# Patient Record
Sex: Male | Born: 1955
Health system: Southern US, Community
[De-identification: ages and names within clinical notes are randomized; demographics above are authoritative.]

## PROBLEM LIST (undated history)

## (undated) DIAGNOSIS — F4310 Post-traumatic stress disorder, unspecified: Secondary | ICD-10-CM

## (undated) DIAGNOSIS — R3129 Other microscopic hematuria: Secondary | ICD-10-CM

## (undated) DIAGNOSIS — I10 Essential (primary) hypertension: Secondary | ICD-10-CM

## (undated) DIAGNOSIS — F1010 Alcohol abuse, uncomplicated: Secondary | ICD-10-CM

## (undated) DIAGNOSIS — Z765 Malingerer [conscious simulation]: Secondary | ICD-10-CM

## (undated) DIAGNOSIS — E559 Vitamin D deficiency, unspecified: Secondary | ICD-10-CM

## (undated) HISTORY — DX: Vitamin D deficiency, unspecified: E55.9

## (undated) HISTORY — PX: ROTATOR CUFF REPAIR: SHX139

## (undated) HISTORY — DX: Other microscopic hematuria: R31.29

## (undated) HISTORY — PX: TONSILLECTOMY: SUR1361

## (undated) HISTORY — DX: Essential (primary) hypertension: I10

## (undated) HISTORY — PX: APPENDECTOMY: SHX54

---

## 2002-05-26 ENCOUNTER — Emergency Department (HOSPITAL_COMMUNITY): Admission: EM | Admit: 2002-05-26 | Discharge: 2002-05-26 | Payer: Self-pay | Admitting: Emergency Medicine

## 2004-06-02 ENCOUNTER — Ambulatory Visit: Payer: Self-pay

## 2008-11-04 ENCOUNTER — Emergency Department (HOSPITAL_COMMUNITY): Admission: EM | Admit: 2008-11-04 | Discharge: 2008-11-04 | Payer: Self-pay | Admitting: Emergency Medicine

## 2008-11-26 ENCOUNTER — Emergency Department (HOSPITAL_COMMUNITY): Admission: EM | Admit: 2008-11-26 | Discharge: 2008-11-26 | Payer: Self-pay | Admitting: Emergency Medicine

## 2011-02-17 ENCOUNTER — Emergency Department (HOSPITAL_COMMUNITY)
Admission: EM | Admit: 2011-02-17 | Discharge: 2011-02-18 | Disposition: A | Discharge status: Home / Self Care | Payer: Self-pay | Attending: Emergency Medicine | Admitting: Emergency Medicine

## 2011-02-17 ENCOUNTER — Encounter: Payer: Self-pay | Admitting: Emergency Medicine

## 2011-02-17 DIAGNOSIS — S61209A Unspecified open wound of unspecified finger without damage to nail, initial encounter: Secondary | ICD-10-CM | POA: Insufficient documentation

## 2011-02-17 DIAGNOSIS — S61218A Laceration without foreign body of other finger without damage to nail, initial encounter: Secondary | ICD-10-CM

## 2011-02-17 DIAGNOSIS — W260XXA Contact with knife, initial encounter: Secondary | ICD-10-CM | POA: Insufficient documentation
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
## 2011-02-18 MED ORDER — AMOXICILLIN-POT CLAVULANATE 875-125 MG PO TABS
1.0000 | ORAL_TABLET | ORAL | Status: AC
  Administered 2011-02-18: 1 via ORAL
  Filled 2011-02-18: qty 1

## 2011-02-18 MED ORDER — AMOXICILLIN-POT CLAVULANATE 875-125 MG PO TABS: 1.0000 | ORAL_TABLET | Freq: Two times a day (BID) | ORAL | Status: AC

## 2011-02-18 MED ORDER — TETANUS-DIPHTHERIA TOXOIDS TD 5-2 LFU IM INJ
0.5000 mL | INJECTION | Freq: Once | INTRAMUSCULAR | Status: AC
  Administered 2011-02-18: 0.5 mL via INTRAMUSCULAR
  Filled 2011-02-18: qty 0.5

## 2011-02-18 NOTE — ED Provider Notes (Signed)
Medical screening examination/treatment/procedure(s) were performed by non-physician practitioner and as supervising physician I was immediately available for consultation/collaboration.   Viera Okonski L Deondre Marinaro, MD 02/18/11 0732 
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
## 2011-02-18 NOTE — ED Notes (Signed)
Pt awaiting meds from pharmacy- notified of same.

## 2015-05-23 ENCOUNTER — Ambulatory Visit (INDEPENDENT_AMBULATORY_CARE_PROVIDER_SITE_OTHER): Payer: Self-pay | Admitting: Internal Medicine

## 2015-05-23 ENCOUNTER — Encounter: Payer: Self-pay | Admitting: Internal Medicine

## 2015-05-23 VITALS — BP 161/119 | HR 106 | Temp 97.6°F | Ht 69.0 in | Wt 145.8 lb

## 2015-05-23 DIAGNOSIS — M25511 Pain in right shoulder: Secondary | ICD-10-CM

## 2015-05-23 DIAGNOSIS — F17200 Nicotine dependence, unspecified, uncomplicated: Secondary | ICD-10-CM

## 2015-05-23 DIAGNOSIS — Z0000 Encounter for general adult medical examination without abnormal findings: Secondary | ICD-10-CM

## 2015-05-23 DIAGNOSIS — G4700 Insomnia, unspecified: Secondary | ICD-10-CM | POA: Insufficient documentation

## 2015-05-23 DIAGNOSIS — R3129 Other microscopic hematuria: Secondary | ICD-10-CM | POA: Insufficient documentation

## 2015-05-23 DIAGNOSIS — Z1211 Encounter for screening for malignant neoplasm of colon: Secondary | ICD-10-CM

## 2015-05-23 DIAGNOSIS — Z720 Tobacco use: Secondary | ICD-10-CM | POA: Insufficient documentation

## 2015-05-23 DIAGNOSIS — I10 Essential (primary) hypertension: Secondary | ICD-10-CM | POA: Insufficient documentation

## 2015-05-23 NOTE — Assessment & Plan Note (Signed)
Had microscopic hematuria in the past per patient, was put on meds for this per patient. I don't have any records to confirm this. I wanted to repeat UA today but patient could not give us a sample. -will try to get [facility] (patient stated his ortho doctor put him on medication for hematuria).

## 2015-05-23 NOTE — Assessment & Plan Note (Signed)
Has some insomnia (trouble falling asleep). Watched TV until late night, drinks [facility]L of [facility]). Shares house with room mates so they do all of their activities (eating, watching tv, etc. In their room). Also has 2 dogs that sleep with them.  - likely 2/2 to poor sleep hygiene and caffeine. Consulted to cut down on caffeine intake and to stop watching TV at least 1-2 hours before going to bed.
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
## 2015-05-23 NOTE — Assessment & Plan Note (Signed)
Had right shoulder rotator cuff tear s/p surgery on 10/2013. Has residual pain still on right shoulder. Naproxen helps.  -asked to continue PRN tylenol or NSAID for pain.

## 2015-05-23 NOTE — Assessment & Plan Note (Addendum)
Filed Vitals:   05/23/15 1038  BP: 161/119  Pulse: 106  Temp: 97.6 F (36.4 C)   BP elevated. He was on lisinopril in the past but not on amy meds now. Will check CMET today, if everything is normal may put him back to lisinopril low dose.  F/up in 3 months.

## 2015-05-23 NOTE — Assessment & Plan Note (Signed)
Continues to smoke 1/2 PPD. Wants to quit but not ready for it yet. He wants to try nicotine patches in the future if he is able to obtain the orange card.

## 2015-05-23 NOTE — Patient Instructions (Signed)
We will get some basic lab today to start off.  Based on lab results I will probably start you on a blood pressure medication. Will call you and let you know.  Please return the stool card with stool sample.  Work on getting the orange card.  Take tylenol for joint pain.

## 2015-05-23 NOTE — Assessment & Plan Note (Signed)
Due for cscope but will do stool card for now since he has no insurance.

## 2015-05-24 LAB — COMPREHENSIVE METABOLIC PANEL
A/G RATIO: 1.8 (ref 1.1–2.5)
ALBUMIN: 4.3 g/dL (ref 3.6–4.8)
ALT: 70 IU/L — ABNORMAL HIGH (ref 0–44)
AST: 105 IU/L — ABNORMAL HIGH (ref 0–40)
Alkaline Phosphatase: 48 IU/L (ref 39–117)
BILIRUBIN TOTAL: 0.4 mg/dL (ref 0.0–1.2)
BUN / CREAT RATIO: 9 — AB (ref 10–22)
BUN: 7 mg/dL — ABNORMAL LOW (ref 8–27)
CALCIUM: 9.4 mg/dL (ref 8.6–10.2)
CHLORIDE: 96 mmol/L (ref 96–106)
CO2: 24 mmol/L (ref 18–29)
Creatinine, Ser: 0.82 mg/dL (ref 0.76–1.27)
GFR, EST AFRICAN AMERICAN: 110 mL/min/{1.73_m2} (ref >59)
GFR, EST NON AFRICAN AMERICAN: 95 mL/min/{1.73_m2} (ref >59)
Globulin, Total: 2.4 g/dL (ref 1.5–4.5)
Glucose: 91 mg/dL (ref 65–99)
POTASSIUM: 4.6 mmol/L (ref 3.5–5.2)
Sodium: 138 mmol/L (ref 134–144)
TOTAL PROTEIN: 6.7 g/dL (ref 6.0–8.5)

## 2015-05-24 LAB — CBC
HEMATOCRIT: 45.5 % (ref 37.5–51.0)
HEMOGLOBIN: 16.4 g/dL (ref 12.6–17.7)
MCH: 34.7 pg — AB (ref 26.6–33.0)
MCHC: 36 g/dL — AB (ref 31.5–35.7)
MCV: 96 fL (ref 79–97)
Platelets: 255 10*3/uL (ref 150–379)
RBC: 4.72 x10E6/uL (ref 4.14–5.80)
RDW: 13.9 % (ref 12.3–15.4)
WBC: 5.9 10*3/uL (ref 3.4–10.8)

## 2015-05-24 NOTE — Progress Notes (Signed)
Medicine attending: Medical history, presenting problems, physical findings, and medications, reviewed with resident physician Dr Tasrif Ahmed on the day of the patient visit and I concur with his evaluation and management plan. 

## 2015-05-27 ENCOUNTER — Ambulatory Visit: Payer: Self-pay

## 2015-05-31 ENCOUNTER — Other Ambulatory Visit (INDEPENDENT_AMBULATORY_CARE_PROVIDER_SITE_OTHER): Payer: Self-pay

## 2015-05-31 DIAGNOSIS — Z1211 Encounter for screening for malignant neoplasm of colon: Secondary | ICD-10-CM

## 2015-05-31 LAB — POC HEMOCCULT BLD/STL (HOME/3-CARD/SCREEN)
Card #2 Fecal Occult Blod, POC: NEGATIVE
Card #3 Fecal Occult Blood, POC: NEGATIVE
FECAL OCCULT BLD: NEGATIVE

## 2015-05-31 NOTE — Addendum Note (Signed)
Addended by: Bufford SpikesFULCHER, Jacklin Zwick N on: 05/31/2015 02:19 PM   Modules accepted: Orders

## 2015-06-05 ENCOUNTER — Telehealth: Payer: Self-pay | Admitting: Internal Medicine

## 2015-06-05 NOTE — Telephone Encounter (Signed)
APPT. REMINDER CALL, LMTCB °

## 2015-06-06 ENCOUNTER — Ambulatory Visit: Payer: Self-pay

## 2015-06-15 ENCOUNTER — Emergency Department (HOSPITAL_COMMUNITY)
Admission: EM | Admit: 2015-06-15 | Discharge: 2015-06-15 | Disposition: A | Discharge status: Home / Self Care | Payer: Self-pay | Attending: Emergency Medicine | Admitting: Emergency Medicine

## 2015-06-15 ENCOUNTER — Encounter (HOSPITAL_COMMUNITY): Payer: Self-pay | Admitting: *Deleted

## 2015-06-15 DIAGNOSIS — Y9301 Activity, walking, marching and hiking: Secondary | ICD-10-CM | POA: Insufficient documentation

## 2015-06-15 DIAGNOSIS — I10 Essential (primary) hypertension: Secondary | ICD-10-CM | POA: Insufficient documentation

## 2015-06-15 DIAGNOSIS — F17210 Nicotine dependence, cigarettes, uncomplicated: Secondary | ICD-10-CM | POA: Insufficient documentation

## 2015-06-15 DIAGNOSIS — Y92410 Unspecified street and highway as the place of occurrence of the external cause: Secondary | ICD-10-CM | POA: Insufficient documentation

## 2015-06-15 DIAGNOSIS — S59912A Unspecified injury of left forearm, initial encounter: Secondary | ICD-10-CM | POA: Insufficient documentation

## 2015-06-15 DIAGNOSIS — Y998 Other external cause status: Secondary | ICD-10-CM | POA: Insufficient documentation
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
## 2015-06-19 ENCOUNTER — Telehealth: Payer: Self-pay | Admitting: Internal Medicine

## 2015-06-19 NOTE — Telephone Encounter (Signed)
APPT. REMINDER CALL, LMTCB °

## 2015-06-20 ENCOUNTER — Ambulatory Visit: Payer: Self-pay

## 2015-06-26 ENCOUNTER — Encounter: Payer: Self-pay | Admitting: Internal Medicine

## 2015-06-26 ENCOUNTER — Ambulatory Visit (INDEPENDENT_AMBULATORY_CARE_PROVIDER_SITE_OTHER): Payer: Self-pay | Admitting: Internal Medicine

## 2015-06-26 VITALS — BP 165/121 | HR 112 | Temp 98.2°F | Ht 69.0 in | Wt 143.6 lb

## 2015-06-26 DIAGNOSIS — R3129 Other microscopic hematuria: Secondary | ICD-10-CM

## 2015-06-26 DIAGNOSIS — M25511 Pain in right shoulder: Secondary | ICD-10-CM

## 2015-06-26 DIAGNOSIS — Z87448 Personal history of other diseases of urinary system: Secondary | ICD-10-CM

## 2015-06-26 DIAGNOSIS — I10 Essential (primary) hypertension: Secondary | ICD-10-CM

## 2015-06-26 DIAGNOSIS — Z720 Tobacco use: Secondary | ICD-10-CM

## 2015-06-26 DIAGNOSIS — F17210 Nicotine dependence, cigarettes, uncomplicated: Secondary | ICD-10-CM

## 2015-06-26 DIAGNOSIS — Z0000 Encounter for general adult medical examination without abnormal findings: Secondary | ICD-10-CM

## 2015-06-26 DIAGNOSIS — R52 Pain, unspecified: Secondary | ICD-10-CM

## 2015-06-26 MED ORDER — HYDROCHLOROTHIAZIDE 25 MG PO TABS: 25.0000 mg | ORAL_TABLET | Freq: Every day | ORAL | Status: DC

## 2015-06-26 NOTE — Assessment & Plan Note (Signed)
Smokes about 1/2 ppd since 60 yo.  States he has cut back recently. -advised smoking cessation and given info on quitline -may be a candidate for low-dose CT scan given history

## 2015-06-26 NOTE — Progress Notes (Signed)
Patient ID: Warren Patrick, male   DOB: 01/06/1956, 60 y.o.   MRN: [id number]     Subjective:   Patient ID: Warren Patrick male    DOB: 03/07/1956 60 y.o.    MRN: [id number] Health Maintenance Due: Health Maintenance Due  Topic Date Due  . Hepatitis C Screening  01-24-1956  . HIV Screening  04/23/1969  . COLONOSCOPY  04/23/2004  . ZOSTAVAX  04/23/2014    _________________________________________________  HPI: Mr.Devrin W Georganna SkeansRoscoe is a 60 y.o. male here for f/u visit for HTN and chronic pain.  We will address his chronic pain at a future visit as we discussed.   Pt has a PMH outlined below.  Please see problem-based charting assessment and plan for further status of patient's chronic medical problems addressed at today's visit.  PMH: Past Medical History  Diagnosis Date  . HTN (hypertension)   . Hematuria, microscopic     Medications: No current outpatient prescriptions on file prior to visit.   No current facility-administered medications on file prior to visit.    Allergies: Allergies  Allergen Reactions  . Bee Venom Swelling    FH: Family History  Problem Relation Age of Onset  . Cancer Mother     breast cancer  . Cancer Father     lung cancer  . Cancer Brother     pancreatic cancer  . Hypertension Father   . Diabetes Brother   . Diabetes Sister     SH: Social History   Social History  . Marital Status: Divorced    Spouse Name: N/A  . Number of Children: N/A  . Years of Education: N/A   Social History Main Topics  . Smoking status: Current Every Day Smoker -- 0.50 packs/day    Types: Cigarettes  . Smokeless tobacco: None     Comment: 1/2PPD  . Alcohol Use: 16.8 oz/week    28 Standard drinks or equivalent per week     Comment: Mixed drinks daily.  . Drug Use: No  . Sexual Activity:    Partners: Female   Other Topics Concern  . None   Social History Narrative    Review of Systems: Constitutional: Negative for fever, chills.    Cardiovascular: +chest pain.  Gastrointestinal: +nausea, vomiting. Neurological: +dizziness.   Objective:   Vital Signs: Filed Vitals:   06/26/15 1353  BP: 165/121  Pulse: 112  Temp: 98.2 F (36.8 C)  TempSrc: Oral  Height: 5\' 9"  (1.753 m)  Weight: 143 lb 9.6 oz (65.137 kg)  SpO2: 100%      BP Readings from Last 3 Encounters:  06/26/15 165/121  06/15/15 172/125  05/23/15 161/119    Physical Exam: Constitutional: Vital signs reviewed.  Patient is in NAD and cooperative with exam.  Head: Normocephalic and atraumatic. Eyes: EOMI, conjunctivae nl, no scleral icterus.  Neck: Supple. Cardiovascular: RRR, no MRG. Pulmonary/Chest: normal effort, CTAB, no wheezes, rales, or rhonchi. Abdominal: Soft. NT/ND +BS. Musculoskeletal: Heberden's and bouchard's nodes b/l.  Neurological: A&O x3, cranial nerves II-XII are grossly intact, moving all extremities. Extremities: No LE edema. Skin: Warm, dry and intact. No rash.   Assessment & Plan:   Assessment and plan was discussed and formulated with my attending.

## 2015-06-26 NOTE — Assessment & Plan Note (Addendum)
Pt presents for f/u of HTN.  Denies fever/chills.  Endorses occasional non-exertional chest pain which he describes as sharp.  States it mainly occurs while watching TV.  Continues to smoke 1/2 ppd since 12.  Drinks 2-3 mixed drinks per night to help with sleep.   -begin HCTZ 25mg   -f/u in 2 weeks  -advised smoking cessation and to limit drinks to 2/night  -lipid panel

## 2015-06-26 NOTE — Assessment & Plan Note (Addendum)
Will discuss colonoscopy at next OV.  -needs colonoscopy

## 2015-06-26 NOTE — Patient Instructions (Signed)
Thank you for your visit today.   Please return to the internal medicine clinic in about 2 weeks or sooner if needed to recheck BP.     I have made the following additions/changes to your medications:  I have given you a prescription for your blood pressure medication, hydrochlorothizide), please take this daily at the same time.   Please be sure to bring all of your medications with you to every visit; this includes herbal supplements, vitamins, eye drops, and any over-the-counter medications.   Should you have any questions regarding your medications and/or any new or worsening symptoms, please be sure to call the clinic at [phone].   If you believe that you are suffering from a life threatening condition or one that may result in the loss of limb or function, then you should call 911 and proceed to the nearest Emergency Department.   Smoking Cessation, Tips for Success If you are ready to quit smoking, congratulations! You have chosen to help yourself be healthier. Cigarettes bring nicotine, tar, carbon monoxide, and other irritants into your body. Your lungs, heart, and blood vessels will be able to work better without these poisons. There are many different ways to quit smoking. Nicotine gum, nicotine patches, a nicotine inhaler, or nicotine nasal spray can help with physical craving. Hypnosis, support groups, and medicines help break the habit of smoking. WHAT THINGS CAN I DO TO MAKE QUITTING EASIER?  Here are some tips to help you quit for good:  Pick a date when you will quit smoking completely. Tell all of your friends and family about your plan to quit on that date.  Do not try to slowly cut down on the number of cigarettes you are smoking. Pick a quit date and quit smoking completely starting on that day.  Throw away all cigarettes.   Clean and remove all ashtrays from your home, work, and car.  On a card, write down your reasons for quitting. Carry the card with you and  read it when you get the urge to smoke.  Cleanse your body of nicotine. Drink enough water and fluids to keep your urine clear or pale yellow. Do this after quitting to flush the nicotine from your body.  Learn to predict your moods. Do not let a bad situation be your excuse to have a cigarette. Some situations in your life might tempt you into wanting a cigarette.  Never have "just one" cigarette. It leads to wanting another and another. Remind yourself of your decision to quit.  Change habits associated with smoking. If you smoked while driving or when feeling stressed, try other activities to replace smoking. Stand up when drinking your coffee. Brush your teeth after eating. Sit in a different chair when you read the paper. Avoid alcohol while trying to quit, and try to drink fewer caffeinated beverages. Alcohol and caffeine may urge you to smoke.  Avoid foods and drinks that can trigger a desire to smoke, such as sugary or spicy foods and alcohol.  Ask people who smoke not to smoke around you.  Have something planned to do right after eating or having a cup of coffee. For example, plan to take a walk or exercise.  Try a relaxation exercise to calm you down and decrease your stress. Remember, you may be tense and nervous for the first 2 weeks after you quit, but this will pass.  Find new activities to keep your hands busy. Play with a pen, coin, or rubber band. Doodle  or draw things on paper.  Brush your teeth right after eating. This will help cut down on the craving for the taste of tobacco after meals. You can also try mouthwash.   Use oral substitutes in place of cigarettes. Try using lemon drops, carrots, cinnamon sticks, or chewing gum. Keep them handy so they are available when you have the urge to smoke.  When you have the urge to smoke, try deep breathing.  Designate your home as a nonsmoking area.  If you are a heavy smoker, ask your health care provider about a prescription  for nicotine chewing gum. It can ease your withdrawal from nicotine.  Reward yourself. Set aside the cigarette money you save and buy yourself something nice.  Look for support from others. Join a support group or smoking cessation program. Ask someone at home or at work to help you with your plan to quit smoking.  Always ask yourself, "Do I need this cigarette or is this just a reflex?" Tell yourself, "Today, I choose not to smoke," or "I do not want to smoke." You are reminding yourself of your decision to quit.  Do not replace cigarette smoking with electronic cigarettes (commonly called e-cigarettes). The safety of e-cigarettes is unknown, and some may contain harmful chemicals.  If you relapse, do not give up! Plan ahead and think about what you will do the next time you get the urge to smoke. HOW WILL I FEEL WHEN I QUIT SMOKING? You may have symptoms of withdrawal because your body is used to nicotine (the addictive substance in cigarettes). You may crave cigarettes, be irritable, feel very hungry, cough often, get headaches, or have difficulty concentrating. The withdrawal symptoms are only temporary. They are strongest when you first quit but will go away within 10-14 days. When withdrawal symptoms occur, stay in control. Think about your reasons for quitting. Remind yourself that these are signs that your body is healing and getting used to being without cigarettes. Remember that withdrawal symptoms are easier to treat than the major diseases that smoking can cause.  Even after the withdrawal is over, expect periodic urges to smoke. However, these cravings are generally short lived and will go away whether you smoke or not. Do not smoke! WHAT RESOURCES ARE AVAILABLE TO HELP ME QUIT SMOKING? Your health care provider can direct you to community resources or hospitals for support, which may include:  Group support.  Education.  Hypnosis.  Therapy.   This information is not intended to  replace advice given to you by your health care provider. Make sure you discuss any questions you have with your health care provider.   Document Released: 11/29/2003 Document Revised: 03/23/2014 Document Reviewed: 08/18/2012 Elsevier Interactive Patient Education 2016 Elsevier Inc.  DASH Eating Plan DASH stands for "Dietary Approaches to Stop Hypertension." The DASH eating plan is a healthy eating plan that has been shown to reduce high blood pressure (hypertension). Additional health benefits may include reducing the risk of type 2 diabetes mellitus, heart disease, and stroke. The DASH eating plan may also help with weight loss. WHAT DO I NEED TO KNOW ABOUT THE DASH EATING PLAN? For the DASH eating plan, you will follow these general guidelines:  Choose foods with a percent daily value for sodium of less than 5% (as listed on the food label).  Use salt-free seasonings or herbs instead of table salt or sea salt.  Check with your health care provider or pharmacist before using salt substitutes.  Eat lower-sodium  products, often labeled as "lower sodium" or "no salt added."  Eat fresh foods.  Eat more vegetables, fruits, and low-fat dairy products.  Choose whole grains. Look for the word "whole" as the first word in the ingredient list.  Choose fish and skinless chicken or [place] more often than red meat. Limit fish, poultry, and meat to 6 oz (170 g) each day.  Limit sweets, desserts, sugars, and sugary drinks.  Choose heart-healthy fats.  Limit cheese to 1 oz (28 g) per day.  Eat more home-cooked food and less restaurant, buffet, and fast food.  Limit fried foods.  Cook foods using methods other than frying.  Limit canned vegetables. If you do use them, rinse them well to decrease the sodium.  When eating at a restaurant, ask that your food be prepared with less salt, or no salt if possible. WHAT FOODS CAN I EAT? Seek help from a dietitian for individual calorie  needs. Grains Whole grain or whole wheat bread. Brown rice. Whole grain or whole wheat pasta. Quinoa, bulgur, and whole grain cereals. Low-sodium cereals. Corn or whole wheat flour tortillas. Whole grain cornbread. Whole grain crackers. Low-sodium crackers. Vegetables Fresh or frozen vegetables (raw, steamed, roasted, or grilled). Low-sodium or reduced-sodium tomato and vegetable juices. Low-sodium or reduced-sodium tomato sauce and paste. Low-sodium or reduced-sodium canned vegetables.  Fruits All fresh, canned (in natural juice), or frozen fruits. Meat and Other Protein Products Ground beef (85% or leaner), grass-fed beef, or beef trimmed of fat. Skinless chicken or [place]. Ground chicken or [place]. Pork trimmed of fat. All fish and seafood. Eggs. Dried beans, peas, or lentils. Unsalted nuts and seeds. Unsalted canned beans. Dairy Low-fat dairy products, such as skim or 1% milk, 2% or reduced-fat cheeses, low-fat ricotta or cottage cheese, or plain low-fat yogurt. Low-sodium or reduced-sodium cheeses. Fats and Oils Tub margarines without trans fats. Light or reduced-fat mayonnaise and salad dressings (reduced sodium). Avocado. Safflower, olive, or canola oils. Natural peanut or almond butter. Other Unsalted popcorn and pretzels. The items listed above may not be a complete list of recommended foods or beverages. Contact your dietitian for more options. WHAT FOODS ARE NOT RECOMMENDED? Grains White bread. White pasta. White rice. Refined cornbread. Bagels and croissants. Crackers that contain trans fat. Vegetables Creamed or fried vegetables. Vegetables in a cheese sauce. Regular canned vegetables. Regular canned tomato sauce and paste. Regular tomato and vegetable juices. Fruits Dried fruits. Canned fruit in light or heavy syrup. Fruit juice. Meat and Other Protein Products Fatty cuts of meat. Ribs, chicken wings, bacon, sausage, bologna, salami, chitterlings, fatback, hot dogs, bratwurst,  and packaged luncheon meats. Salted nuts and seeds. Canned beans with salt. Dairy Whole or 2% milk, cream, half-and-half, and cream cheese. Whole-fat or sweetened yogurt. Full-fat cheeses or blue cheese. Nondairy creamers and whipped toppings. Processed cheese, cheese spreads, or cheese curds. Condiments Onion and garlic salt, seasoned salt, table salt, and sea salt. Canned and packaged gravies. Worcestershire sauce. Tartar sauce. Barbecue sauce. Teriyaki sauce. Soy sauce, including reduced sodium. Steak sauce. Fish sauce. Oyster sauce. Cocktail sauce. Horseradish. Ketchup and mustard. Meat flavorings and tenderizers. Bouillon cubes. Hot sauce. Tabasco sauce. Marinades. Taco seasonings. Relishes. Fats and Oils Butter, stick margarine, lard, shortening, ghee, and bacon fat. Coconut, palm kernel, or palm oils. Regular salad dressings. Other Pickles and olives. Salted popcorn and pretzels. The items listed above may not be a complete list of foods and beverages to avoid. Contact your dietitian for more information. WHERE CAN I FIND MORE  INFORMATION? National Heart, Lung, and Blood Institute: CableProm[employer].itwww.nhlbi.nih.gov/health/health-topics/topics/dash/   This information is not intended to replace advice given to you by your health care provider. Make sure you discuss any questions you have with your health care provider.   Document Released: 02/19/2011 Document Revised: 03/23/2014 Document Reviewed: 01/04/2013 Elsevier Interactive Patient Education [employer] Elsevier Inc.

## 2015-06-26 NOTE — Assessment & Plan Note (Addendum)
Was told previously he had hematuria.  No history of exposure to dyes.  However, he is a long term smoker which puts him at risk for malignancy.  When asked if he could provide a urine sample he stated "I just peed before coming in."  Of note, he was unable to provide a urine sample on his last OV.  -check UA

## 2015-06-26 NOTE — Assessment & Plan Note (Signed)
He discussed widespread pain in his hands, feet, back, etc.  I explained that we would discuss this at a future visit. -cont to monitor -will address further

## 2015-06-27 LAB — MICROSCOPIC EXAMINATION: Casts: NONE SEEN /lpf

## 2015-06-27 LAB — LIPID PANEL
CHOLESTEROL TOTAL: 192 mg/dL (ref 100–199)
Chol/HDL Ratio: 3.8 ratio units (ref 0.0–5.0)
HDL: 51 mg/dL (ref >39)
LDL Calculated: 115 mg/dL — ABNORMAL HIGH (ref 0–99)
Triglycerides: 131 mg/dL (ref 0–149)
VLDL CHOLESTEROL CAL: 26 mg/dL (ref 5–40)

## 2015-06-27 LAB — URINALYSIS, ROUTINE W REFLEX MICROSCOPIC
BILIRUBIN UA: NEGATIVE
Glucose, UA: NEGATIVE
Nitrite, UA: POSITIVE — AB
PH UA: 6 (ref 5.0–7.5)
Specific Gravity, UA: 1.019 (ref 1.005–1.030)
UUROB: 0.2 mg/dL (ref 0.2–1.0)

## 2015-07-09 ENCOUNTER — Telehealth: Payer: Self-pay | Admitting: Internal Medicine

## 2015-07-09 NOTE — Telephone Encounter (Signed)
APPT. REMINDER CALL, LMTCB °

## 2015-07-10 ENCOUNTER — Ambulatory Visit: Payer: Self-pay | Admitting: Internal Medicine

## 2015-07-10 NOTE — Progress Notes (Signed)
Internal Medicine Clinic Attending  Case discussed with Dr. Gill soon after the resident saw the patient.  We reviewed the resident's history and exam and pertinent patient test results.  I agree with the assessment, diagnosis, and plan of care documented in the resident's note.  

## 2015-07-16 ENCOUNTER — Ambulatory Visit: Payer: Self-pay | Admitting: Internal Medicine

## 2015-07-19 ENCOUNTER — Telehealth: Payer: Self-pay | Admitting: Internal Medicine

## 2015-07-19 NOTE — Telephone Encounter (Signed)
APT. REMINDER CALL, LMTCB °

## 2015-07-22 ENCOUNTER — Ambulatory Visit (INDEPENDENT_AMBULATORY_CARE_PROVIDER_SITE_OTHER): Payer: Self-pay | Admitting: Internal Medicine

## 2015-07-22 ENCOUNTER — Encounter: Payer: Self-pay | Admitting: Internal Medicine

## 2015-07-22 VITALS — BP 170/110 | HR 80 | Temp 98.1°F | Ht 69.0 in | Wt 139.4 lb

## 2015-07-22 DIAGNOSIS — F32A Depression, unspecified: Secondary | ICD-10-CM

## 2015-07-22 DIAGNOSIS — F329 Major depressive disorder, single episode, unspecified: Secondary | ICD-10-CM

## 2015-07-22 DIAGNOSIS — IMO0002 Reserved for concepts with insufficient information to code with codable children: Secondary | ICD-10-CM

## 2015-07-22 DIAGNOSIS — F1020 Alcohol dependence, uncomplicated: Secondary | ICD-10-CM | POA: Insufficient documentation

## 2015-07-22 DIAGNOSIS — F1094 Alcohol use, unspecified with alcohol-induced mood disorder: Secondary | ICD-10-CM | POA: Insufficient documentation

## 2015-07-22 DIAGNOSIS — Z720 Tobacco use: Secondary | ICD-10-CM

## 2015-07-22 DIAGNOSIS — F1090 Alcohol use, unspecified, uncomplicated: Secondary | ICD-10-CM

## 2015-07-22 DIAGNOSIS — E785 Hyperlipidemia, unspecified: Secondary | ICD-10-CM

## 2015-07-22 DIAGNOSIS — F17210 Nicotine dependence, cigarettes, uncomplicated: Secondary | ICD-10-CM

## 2015-07-22 DIAGNOSIS — I10 Essential (primary) hypertension: Secondary | ICD-10-CM

## 2015-07-22 DIAGNOSIS — F322 Major depressive disorder, single episode, severe without psychotic features: Secondary | ICD-10-CM | POA: Insufficient documentation

## 2015-07-22 MED ORDER — HYDROCHLOROTHIAZIDE 25 MG PO TABS: 25.0000 mg | ORAL_TABLET | Freq: Every day | ORAL | Status: DC

## 2015-07-22 MED ORDER — SIMVASTATIN 80 MG PO TABS: 80.0000 mg | ORAL_TABLET | Freq: Every day | ORAL | Status: DC

## 2015-07-22 NOTE — Progress Notes (Signed)
   Subjective:    Patient ID: Warren Patrick, male    DOB: 10/04/1955, 60 y.o.   MRN: [id number]  HPI  Mr. 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 With respect to his HTN, it was intended for him to be taking HCTZ 25 mg after his appointment on 4/12. However, he never picked up this medication from [facility] as he does not have an independent source of income. Today, he denies any headaches or vision changes. He's had multiple family members who've had heart attacks. He drinks 3 mixed drinks daily, feeling he needs to cut down on alcohol use and occasionally feels guilty. He endorses a depressed mood, including some insomnia, anhedonia, psychomotor agitation, and low energy. He has felt this way since being laid off two years ago from a right rotator cuff injury.He denies any suicidal ideation. He continues to smoke 1/2 ppd and is not ready to quit.   Review of Systems  Constitutional: Positive for fatigue. Negative for fever.  HENT: Negative for congestion and sore throat.   Eyes: Negative for redness and itching.  Respiratory: Negative for shortness of breath and wheezing.   Cardiovascular: Negative for chest pain and palpitations.  Gastrointestinal: Negative for abdominal pain and diarrhea.  Endocrine: Negative for polydipsia and polyuria.  Genitourinary: Negative for dysuria and hematuria.  Musculoskeletal: Positive for back pain. Negative for arthralgias.  Skin: Negative for rash and wound.  Neurological: Negative for light-headedness and headaches.  Psychiatric/Behavioral: Positive for sleep disturbance and dysphoric mood. Negative for suicidal ideas and decreased concentration. The patient is nervous/anxious.        Objective:   Physical Exam  Constitutional:  Thin-appearing man.  HENT:  Mouth/Throat: Oropharynx is clear and moist.  No oropharyngeal exudate.  Eyes: Conjunctivae are normal. No scleral icterus.  Neck: Neck supple.  Cardiovascular: Normal rate, regular rhythm and normal heart sounds.   Pulmonary/Chest: Effort normal and breath sounds normal. No respiratory distress. He has no wheezes.  Abdominal: Soft. Bowel sounds are normal. He exhibits no distension. There is no tenderness.  Musculoskeletal: Normal range of motion. He exhibits no edema.  Lymphadenopathy:    He has no cervical adenopathy.  Neurological: He is alert. He has normal reflexes.  Skin: Skin is warm and dry. He is not diaphoretic.  Psychiatric: He has a normal mood and affect. His behavior is normal.  Vitals reviewed.         Assessment & Plan:   Please see problem based assessment and plan for details.

## 2015-07-22 NOTE — Assessment & Plan Note (Signed)
A: I counseled the patient and showed him that he will spend $18,000 in the future on cigarettes. Counseling was for 5 minutes and included the risks of smoking and smoking cessation strategies. He is not ready to quit at this time.  P: Reassess at next visit

## 2015-07-22 NOTE — Assessment & Plan Note (Signed)
A: Likely driven by his lack of work, which he loved to do, and alcoholism. His depression is minor at this time. SSRI therapy is not warranted at this time.  P: Reassess at future clinic visits.

## 2015-07-22 NOTE — Assessment & Plan Note (Signed)
A: Patient has not picked up his medication due to financial reasons. His BP remains elevated today at 170/110 off of medication. I explained to him that his monthly costs of cigarettes are manyfold greater than the $4 monthly cost of HCTZ. I demonstrated that his 10-year cardiovascular risk score decreases from 22% to ~14% with better control of his BP, to 120/80. He concurred at the end of this discussion that obtaining obtaining his medication would be a priority.  P: HCTZ 25 mg daily Referral to Medication Assistance Program F/u 2 weeks

## 2015-07-22 NOTE — Patient Instructions (Signed)
Warren Patrick,  It was a pleasure meeting you today.  Your blood pressure is very high. This puts you at risk for stroke and heart attack. Your chance of a heart attack over the next 10 years is 22%. By controlling your blood pressure, we can get it down to 14%. If we control you BP and you quit smoking, the risk goes to 9%.   Please pick up your $4 prescription for hydrochlorothiazide from [facility]. Remember, a pack of cigarettes costs ~$5. You will spend $18,000 over the next 20 years on cigarettes if you continue to smoke.  I have also provided [facility]. If you had to choose, go to [facility].  Again, great taking care of you, and have a great day.

## 2015-07-22 NOTE — Assessment & Plan Note (Signed)
A: LDL 115 last visit. 22% 10-year cardiovascular risk score. While he would benefit from a high-intensity statin, simvastatin 80 mg is far more affordable [facility] at $6. However, I told him that if he had to choose between simvastatin and HCTZ, he should opt for HCTZ as this would decrease his cardiovascular risk the most.  P: Simvastatin 80 mg daily

## 2015-07-22 NOTE — Assessment & Plan Note (Signed)
A: Patient drinks 3 mixed alcoholic beverages a day. He denies any withdrawal symptoms. He understand not to stop his alcohol use abruptly. He was counseled for 5 minutes on the risks of long-term alcohol use. He is interested in discussing options and future clinic visits.  P: Reassess in 2 weeks.

## 2015-07-24 MED ORDER — ATORVASTATIN CALCIUM 40 MG PO TABS: 40.0000 mg | ORAL_TABLET | Freq: Every day | ORAL | Status: DC

## 2015-07-24 MED ORDER — HYDROCHLOROTHIAZIDE 25 MG PO TABS: 25.0000 mg | ORAL_TABLET | Freq: Every day | ORAL | Status: DC

## 2015-07-24 MED ORDER — OLMESARTAN MEDOXOMIL 20 MG PO TABS: 20.0000 mg | ORAL_TABLET | Freq: Every day | ORAL | Status: DC

## 2015-07-24 NOTE — Progress Notes (Signed)
Pharmacist-physician Co-visit Assisting with initiation of HCTZ and statin and medication access. Patient states he is unable to afford at this time. [facility] for patient.

## 2015-07-24 NOTE — Progress Notes (Signed)
Case discussed with Dr. Ford at the time of the visit.  We reviewed the resident's history and exam and pertinent patient test results.  I agree with the assessment, diagnosis and plan of care documented in the resident's note. 

## 2015-07-24 NOTE — Addendum Note (Signed)
Addended by: Mliss FritzKIM, JENNIFER J on: 07/24/2015 11:55 AM   Modules accepted: Orders

## 2015-07-24 NOTE — Addendum Note (Signed)
Addended by: Mliss FritzKIM, JENNIFER J on: 07/24/2015 01:09 PM   Modules accepted: Orders, Medications

## 2015-07-24 NOTE — Addendum Note (Signed)
Addended by: Mliss FritzKIM, Tymir Terral J on: 07/24/2015 09:58 AM   Modules accepted: Orders

## 2015-07-24 NOTE — Addendum Note (Signed)
Addended by: Mliss FritzKIM, JENNIFER J on: 07/24/2015 10:56 AM   Modules accepted: Orders, Medications

## 2015-07-26 ENCOUNTER — Ambulatory Visit: Payer: Self-pay | Admitting: Pharmacist

## 2015-07-26 DIAGNOSIS — Z596 Low income: Secondary | ICD-10-CM

## 2015-07-26 NOTE — Progress Notes (Signed)
Mr. Warren Patrick came in to pick up 1 mo. supply of HCTZ 25 mg and atorvastatin 40 mg. Patient was counseled on these medications (side effects, administration) and instructed [facility]. Patient expressed understanding to these instructions.

## 2015-07-29 ENCOUNTER — Encounter: Payer: Self-pay | Admitting: Pharmacist

## 2015-07-29 NOTE — Progress Notes (Signed)
Patient was seen in clinic with Joyce Jih, PharmD candidate. I agree with the assessment and plan of care documented. 

## 2015-08-18 ENCOUNTER — Encounter (HOSPITAL_COMMUNITY): Payer: Self-pay | Admitting: Emergency Medicine

## 2015-08-18 ENCOUNTER — Emergency Department (HOSPITAL_COMMUNITY)
Admission: EM | Admit: 2015-08-18 | Discharge: 2015-08-18 | Discharge status: Against Medical Advice | Payer: Self-pay | Attending: Emergency Medicine | Admitting: Emergency Medicine

## 2015-08-18 DIAGNOSIS — I10 Essential (primary) hypertension: Secondary | ICD-10-CM | POA: Insufficient documentation

## 2015-08-18 DIAGNOSIS — Y9389 Activity, other specified: Secondary | ICD-10-CM | POA: Insufficient documentation

## 2015-08-18 DIAGNOSIS — F1010 Alcohol abuse, uncomplicated: Secondary | ICD-10-CM | POA: Insufficient documentation

## 2015-08-18 DIAGNOSIS — X781XXA Intentional self-harm by knife, initial encounter: Secondary | ICD-10-CM | POA: Insufficient documentation

## 2015-08-18 DIAGNOSIS — F329 Major depressive disorder, single episode, unspecified: Secondary | ICD-10-CM | POA: Insufficient documentation

## 2015-08-18 DIAGNOSIS — IMO0002 Reserved for concepts with insufficient information to code with codable children: Secondary | ICD-10-CM

## 2015-08-18 DIAGNOSIS — F17210 Nicotine dependence, cigarettes, uncomplicated: Secondary | ICD-10-CM | POA: Insufficient documentation

## 2015-08-18 DIAGNOSIS — Y9289 Other specified places as the place of occurrence of the external cause: Secondary | ICD-10-CM | POA: Insufficient documentation

## 2015-08-18 DIAGNOSIS — Z79899 Other long term (current) drug therapy: Secondary | ICD-10-CM | POA: Insufficient documentation

## 2015-08-18 DIAGNOSIS — S31113A Laceration without foreign body of abdominal wall, right lower quadrant without penetration into peritoneal cavity, initial encounter: Secondary | ICD-10-CM | POA: Insufficient documentation

## 2015-08-18 DIAGNOSIS — Y998 Other external cause status: Secondary | ICD-10-CM | POA: Insufficient documentation

## 2015-08-18 DIAGNOSIS — F32A Depression, unspecified: Secondary | ICD-10-CM

## 2015-08-18 DIAGNOSIS — S01412A Laceration without foreign body of left cheek and temporomandibular area, initial encounter: Secondary | ICD-10-CM | POA: Insufficient documentation

## 2015-08-18 HISTORY — DX: Post-traumatic stress disorder, unspecified: F43.10

## 2015-08-18 LAB — COMPREHENSIVE METABOLIC PANEL
ALT: 81 U/L — AB (ref 17–63)
AST: 115 U/L — ABNORMAL HIGH (ref 15–41)
Albumin: 3.8 g/dL (ref 3.5–5.0)
Alkaline Phosphatase: 41 U/L (ref 38–126)
Anion gap: 12 (ref 5–15)
BUN: 12 mg/dL (ref 6–20)
CHLORIDE: 89 mmol/L — AB (ref 101–111)
CO2: 32 mmol/L (ref 22–32)
CREATININE: 0.98 mg/dL (ref 0.61–1.24)
Calcium: 9.3 mg/dL (ref 8.9–10.3)
Glucose, Bld: 86 mg/dL (ref 65–99)
Potassium: 3.4 mmol/L — ABNORMAL LOW (ref 3.5–5.1)
Sodium: 133 mmol/L — ABNORMAL LOW (ref 135–145)
TOTAL PROTEIN: 7 g/dL (ref 6.5–8.1)
Total Bilirubin: 1.1 mg/dL (ref 0.3–1.2)

## 2015-08-18 LAB — CBC WITH DIFFERENTIAL/PLATELET
BASOS ABS: 0 10*3/uL (ref 0.0–0.1)
BASOS PCT: 0 %
EOS ABS: 0.1 10*3/uL (ref 0.0–0.7)
EOS PCT: 2 %
HCT: 41.9 % (ref 39.0–52.0)
Hemoglobin: 15 g/dL (ref 13.0–17.0)
LYMPHS PCT: 20 %
Lymphs Abs: 1.3 10*3/uL (ref 0.7–4.0)
MCH: 34.5 pg — ABNORMAL HIGH (ref 26.0–34.0)
MCHC: 35.8 g/dL (ref 30.0–36.0)
MCV: 96.3 fL (ref 78.0–100.0)
Monocytes Absolute: 1.1 10*3/uL — ABNORMAL HIGH (ref 0.1–1.0)
Monocytes Relative: 17 %
Neutro Abs: 4.2 10*3/uL (ref 1.7–7.7)
Neutrophils Relative %: 61 %
PLATELETS: 154 10*3/uL (ref 150–400)
RBC: 4.35 MIL/uL (ref 4.22–5.81)
RDW: 12.7 % (ref 11.5–15.5)
WBC: 6.8 10*3/uL (ref 4.0–10.5)

## 2015-08-18 LAB — ETHANOL: ALCOHOL ETHYL (B): 54 mg/dL — AB (ref <5)

## 2015-08-18 MED ORDER — TETANUS-DIPHTH-ACELL PERTUSSIS 5-2.5-18.5 LF-MCG/0.5 IM SUSP: 0.5000 mL | Freq: Once | INTRAMUSCULAR | Status: DC

## 2015-08-18 MED ORDER — ACETAMINOPHEN 325 MG PO TABS: 650.0000 mg | ORAL_TABLET | ORAL | Status: DC | PRN

## 2015-08-18 MED ORDER — ZOLPIDEM TARTRATE 5 MG PO TABS: 5.0000 mg | ORAL_TABLET | Freq: Every evening | ORAL | Status: DC | PRN

## 2015-08-18 MED ORDER — ALUM & MAG HYDROXIDE-SIMETH 200-200-20 MG/5ML PO SUSP: 30.0000 mL | ORAL | Status: DC | PRN

## 2015-08-18 MED ORDER — NICOTINE 21 MG/24HR TD PT24: 21.0000 mg | MEDICATED_PATCH | Freq: Every day | TRANSDERMAL | Status: DC

## 2015-08-18 MED ORDER — LORAZEPAM 1 MG PO TABS: 1.0000 mg | ORAL_TABLET | Freq: Three times a day (TID) | ORAL | Status: DC | PRN

## 2015-08-18 MED ORDER — BACITRACIN ZINC 500 UNIT/GM EX OINT: 1.0000 "application " | TOPICAL_OINTMENT | Freq: Two times a day (BID) | CUTANEOUS | Status: DC

## 2015-08-18 MED ORDER — IBUPROFEN 400 MG PO TABS: 600.0000 mg | ORAL_TABLET | Freq: Three times a day (TID) | ORAL | Status: DC | PRN

## 2015-08-18 MED ORDER — ONDANSETRON HCL 4 MG PO TABS
4.0000 mg | ORAL_TABLET | Freq: Three times a day (TID) | ORAL | Status: DC | PRN
Start: 2015-08-18 — End: 2015-08-18

## 2015-08-18 NOTE — ED Notes (Signed)
EMT Mellody DanceKeith reports that pt told him he was going outside to smoke and that he would come back.

## 2015-08-18 NOTE — ED Notes (Addendum)
Pt not in room.

## 2015-08-18 NOTE — BH Assessment (Signed)
Assessment Note  Warren Patrick is an 60 y.o. male who [facility] after feeling depressed.  He reports being triggered by being left home alone and "reviewing my [employer] book."  He reports this is the first time he has ever been triggered and denied experiencing any traumatic incidence while on active duty or adult or childhood.  The Patient denied receiving any [facility] healthcare services.   The Patient reports feeling despondent because he has to depend on his paralyzed [place]iance[place] for financial support.  He also reports feeling depressed because he has not worked in 2 years.  The Patient admitted cutting his abdomin because "I was feeling depressed, no I didn't want to kill myself..I know where to stab myself if I want to die."  The Patient presented orientated x4, mood "depressed" affect congruent with mood and flat.  The Patient denied current or past SI, HI, AVH.  The Patient denied previous SI attempts and psychiatric hospitalizations.  He reports this is the first time he has self harm.  The Patient reports sleeping 2 to 3 hours per night for several years and reports that is why he came to the ED.  Patient reports appetite is poor and that he has lost 7 lbs over the past month.  The Patient reports consuming 2 to 3 mix drinks (burbon and 7 up or other citrus drink).  He reports taking 1 mg of Valium given to him by a Friend 3 days ago to assist with sleeping.  The Patient denied any other illicit substance use.          Diagnosis:  Major Depressive Disorder, single episode, moderate, Alcohol Use Disorder, moderate  Past Medical History:  Past Medical History  Diagnosis Date  . HTN (hypertension)   . Hematuria, microscopic   . PTSD (post-traumatic stress disorder)     Past Surgical History  Procedure Laterality Date  . Rotator cuff repair      right shoulder, on 10/2013    Family History:  Family History  Problem Relation Age of Onset  . Cancer Mother     breast cancer  .  Cancer Father     lung cancer  . Cancer Brother     pancreatic cancer  . Hypertension Father   . Diabetes Brother   . Diabetes Sister     Social History:  reports that he has been smoking Cigarettes.  He has been smoking about 0.50 packs per day. He does not have any smokeless tobacco history on file. He reports that he drinks about 16.8 oz of alcohol per week. He reports that he does not use illicit drugs.  Additional Social History:     CIWA: CIWA-Ar Pulse Rate: 96 COWS:    Allergies:  Allergies  Allergen Reactions  . Bee Venom Swelling    Home Medications:  (Not in a hospital admission)  OB/GYN Status:  No LMP for male patient.  General Assessment Data [facility] Assessment: In system Is this a Tele or Face-to-Face Assessment?: Tele Assessment Is this an Initial Assessment or a Re-assessment for this encounter?: Initial Assessment Marital status: Single Maiden name: N/A Is patient pregnant?: No Pregnancy Status: No Living Arrangements: Non-relatives/Friends Management consultant(Fiancee and 2 Friends.) Can pt return to current living arrangement?: Yes Admission Status: Voluntary Is patient capable of signing voluntary admission?: Yes Referral Source: Self/Family/Friend Insurance type: None  Medical Screening Exam ([facility] Walk-in ONLY) Medical Exam completed: Yes  Crisis Care Plan Living Arrangements:  Non-relatives/Friends Steffanie Rainwater and 2 Friends.) Legal Guardian: Other: (Self) Name of Psychiatrist: None Name of Therapist: None  Education Status Is patient currently in school?: No Current Grade: N/A Highest grade of school patient has completed: 12th Name of school: N/A Contact person: N/A  Risk to self with the past 6 months Suicidal Ideation: No (Patient denied) Has patient been a risk to self within the past 6 months prior to admission? : No Suicidal Intent: No Has patient had any suicidal intent within the past 6 months prior to admission? : No Is  patient at risk for suicide?: No Suicidal Plan?: No Has patient had any suicidal plan within the past 6 months prior to admission? : No Access to Means: No What has been your use of drugs/alcohol within the last 12 months?: Alcohol and Benzo Previous Attempts/Gestures: No (Patient denied) How many times?: 0 Other Self Harm Risks: Self harm by cutting (abdomin and face) Triggers for Past Attempts: None known Intentional Self Injurious Behavior: None Family Suicide History: Unknown Recent stressful life event(s): Other (Comment) (Alone at home [place] and Friends left to go to different ev) Persecutory voices/beliefs?: No Depression: Yes Depression Symptoms: Insomnia, Feeling worthless/self pity Substance abuse history and/or treatment for substance abuse?: Yes Suicide prevention information given to non-admitted patients: Not applicable  Risk to Others within the past 6 months Homicidal Ideation: No Does patient have any lifetime risk of violence toward others beyond the six months prior to admission? : No Thoughts of Harm to Others: No Current Homicidal Intent: No Current Homicidal Plan: No Access to Homicidal Means: No Identified Victim: N/A History of harm to others?: No Assessment of Violence: None Noted Violent Behavior Description: None Does patient have access to weapons?: No Criminal Charges Pending?: No Does patient have a court date: No Is patient on probation?: No  Psychosis Hallucinations: None noted Delusions: None noted  Mental Status Report Appearance/Hygiene: Unremarkable Eye Contact: Fair Motor Activity: Unremarkable Speech: Logical/coherent Level of Consciousness: Alert Mood: Depressed Affect: Depressed, Flat Anxiety Level: Minimal Thought Processes: Coherent, Relevant Judgement: Partial Orientation: Person, Place, Time, Situation Obsessive Compulsive Thoughts/Behaviors: None  Cognitive Functioning Concentration: Normal Memory: Recent Intact,  Remote Intact IQ: Average Insight: Good Impulse Control: Poor Appetite: Poor Weight Loss: 7 (over a month) Weight Gain: 0 Sleep: Decreased Total Hours of Sleep: 3 Vegetative Symptoms: None  ADLScreening [facility]) Patient's cognitive ability adequate to safely complete daily activities?: Yes Patient able to express need for assistance with ADLs?: Yes Independently performs ADLs?: Yes (appropriate for developmental age)  Prior Inpatient Therapy Prior Inpatient Therapy: No Prior Therapy Dates: N/A Prior Therapy Facilty/Provider(s): N/A Reason for Treatment: N/A  Prior Outpatient Therapy Prior Outpatient Therapy: No Prior Therapy Dates: N/A Prior Therapy Facilty/Provider(s): N/A Reason for Treatment: N/A Does patient have an ACCT team?: No Does patient have Intensive In-House Services?  : No Does patient have [facility] services? : No Does patient have P4CC services?: No  ADL Screening (condition at time of admission) Patient's cognitive ability adequate to safely complete daily activities?: Yes Is the patient deaf or have difficulty hearing?: No Does the patient have difficulty seeing, even when wearing glasses/contacts?: No Does the patient have difficulty concentrating, remembering, or making decisions?: No Patient able to express need for assistance with ADLs?: Yes Does the patient have difficulty dressing or bathing?: No Independently performs ADLs?: Yes (appropriate for developmental age) Weakness of Legs: None Weakness of Arms/Hands: None  Home Assistive Devices/Equipment Home Assistive Devices/Equipment: None    Abuse/Neglect Assessment (  Assessment to be complete while patient is alone) Physical Abuse: Denies Verbal Abuse: Denies Sexual Abuse: Denies Exploitation of patient/patient's resources: Denies Self-Neglect: Denies Values / Beliefs Cultural Requests During Hospitalization: None Spiritual Requests During Hospitalization: None   Advance  Directives (For Healthcare) Does patient have an advance directive?: No Would patient like information on creating an advanced directive?: No - patient declined information    Additional Information 1:1 In Past 12 Months?: No CIRT Risk: No Elopement Risk: No Does patient have medical clearance?: Yes     Disposition:  Disposition Initial Assessment Completed for this Encounter: Yes Disposition of Patient: Outpatient treatment Type of outpatient treatment: Adult (Psych consult has been placemnent, psych evaluation in the m)  On Site Evaluation by:   Reviewed with Physician:    Dey-Johnson,Vannary Greening 08/18/2015 4:42 AM

## 2015-08-18 NOTE — ED Provider Notes (Signed)
CSN: [id number]     Arrival date & time 08/18/15  0239 History   First MD Initiated Contact with Patient 08/18/15 0303     Chief Complaint  Patient presents with  . Laceration     (Consider location/radiation/quality/duration/timing/severity/associated sxs/prior Treatment) The history is provided by the patient.     Patient is a 60 year old male with history of PTSD, hypertension, who presents emergency Department stating that he had PTSD tonight when he was looking through an outcome of old for., He was home by himself and began to feel depressed and that he started cutting himself with a kitchen knife.  He describes sawing back-and-forth uncontrollably to his abdomen and states that he does not recall cutting across his forehead and his. He states that he normally is at home with his girlfriend and with several roommates.  He states that because he was home alone he felt depressed for the first time.  He denies suicidal ideations. He states that his cutting was to relieve the stress and anxiety of PTSD and he was not attempting suicide.  He jokes about homicidal ideations but states that he is not thinking of killing anyone and 4 years ago he had an argument with his brother is angry with him that he died.  He denies auditory and visual hallucinations. He states that he has trouble sleeping for more than one to 2 hours at a time and wishes here today to help him sleep.  He had one drink of alcohol this evening but denies any other illegal drug use. He states that he is not currently treated for any psychiatric illness. He recently got an orange card and sees internal medicine for routine issues, reports seeing them earlier today.  He also reports many recent stressors and frustration over the past 2 years and cleaning chronic pain rotator cuff surgery, multiple joint issues, loss of insurance, loss of job over the past 2 years. He states he is currently unable to enjoy playing tennis, states, "I am 60  years old and I am falling apart."  Past Medical History  Diagnosis Date  . HTN (hypertension)   . Hematuria, microscopic   . PTSD (post-traumatic stress disorder)    Past Surgical History  Procedure Laterality Date  . Rotator cuff repair      right shoulder, on 10/2013   Family History  Problem Relation Age of Onset  . Cancer Mother     breast cancer  . Cancer Father     lung cancer  . Cancer Brother     pancreatic cancer  . Hypertension Father   . Diabetes Brother   . Diabetes Sister    Social History  Substance Use Topics  . Smoking status: Current Every Day Smoker -- 0.50 packs/day    Types: Cigarettes  . Smokeless tobacco: None     Comment: 1/2PPD  . Alcohol Use: 16.8 oz/week    28 Standard drinks or equivalent per week     Comment: Mixed drinks daily.    Review of Systems  All other systems reviewed and are negative.     Allergies  Bee venom  Home Medications   Prior to Admission medications   Medication Sig Start Date End Date Taking? Authorizing Provider  atorvastatin (LIPITOR) 40 MG tablet Take 1 tablet (40 mg total) by mouth daily. 07/24/15  Yes Deneise LeverParth Saraiya, MD  olmesartan (BENICAR) 20 MG tablet Take 1 tablet (20 mg total) by mouth daily. For MAP 07/24/15  Yes Deneise LeverParth Saraiya, MD  Pulse 96  Temp(Src) 98.3 F (36.8 C) (Oral)  Resp 16  SpO2 97% Physical Exam  Constitutional: He is oriented to person, place, and time. He appears well-developed and well-nourished. No distress.  HENT:  Head: Normocephalic and atraumatic.  Nose: Nose normal.  Mouth/Throat: Oropharynx is clear and moist. No oropharyngeal exudate.  Eyes: Conjunctivae and EOM are normal. Pupils are equal, round, and reactive to light. Right eye exhibits no discharge. Left eye exhibits no discharge. No scleral icterus.  Neck: Normal range of motion. No JVD present. No tracheal deviation present. No thyromegaly present.  Cardiovascular: Normal rate, regular rhythm, normal heart sounds and  intact distal pulses.  Exam reveals no gallop and no friction rub.   No murmur heard. Pulmonary/Chest: Effort normal and breath sounds normal. No respiratory distress. He has no wheezes. He has no rales. He exhibits no tenderness.  Abdominal: Soft. Bowel sounds are normal. He exhibits no distension and no mass. There is no tenderness. There is no rebound and no guarding.  Musculoskeletal: Normal range of motion. He exhibits no edema or tenderness.  Lymphadenopathy:    He has no cervical adenopathy.  Neurological: He is alert and oriented to person, place, and time. He has normal reflexes. No cranial nerve deficit. He exhibits normal muscle tone. Coordination normal.  Skin: Skin is warm. No rash noted. He is not diaphoretic. No erythema. No pallor.  Multiple lacerations to right lower abdomen with superficial Abrasions and superficial lacerations to left cheek, left temple and across forehead   Psychiatric: He has a normal mood and affect. His behavior is normal. Judgment and thought content normal.  Nursing note and vitals reviewed.         ED Course  Procedures (including critical care time) Labs Review Labs Reviewed  COMPREHENSIVE METABOLIC PANEL - Abnormal; Notable for the following:    Sodium 133 (*)    Potassium 3.4 (*)    Chloride 89 (*)    AST 115 (*)    ALT 81 (*)    All other components within normal limits  ETHANOL - Abnormal; Notable for the following:    Alcohol, Ethyl (B) 54 (*)    All other components within normal limits  CBC WITH DIFFERENTIAL/PLATELET - Abnormal; Notable for the following:    MCH 34.5 (*)    Monocytes Absolute 1.1 (*)    All other components within normal limits  URINE RAPID DRUG SCREEN, HOSP PERFORMED  URINALYSIS, ROUTINE W REFLEX MICROSCOPIC (NOT AT [facility])    Imaging Review No results found. I have personally reviewed and evaluated these images and lab results as part of my medical decision-making.   EKG Interpretation None       MDM   60 y/o male presents to the ER with episode tonight of depression with PTSD and self-harm behavior with extensive lacerations to abdomen and to face, which he does not recall how the facial injuries occured.  Med clearance labs obtained, pt spoke with TTS.   No hx of SI or depression.  Pt with known hx of PTSD, no current treatment.  Patient endorses insomnia. Currently he denies SI, HI, and AVH.  I am concerned with pt's presentation, no pmhx of depression or self harm, and pt states that he cannot remember parts of the night or how he obtained lacerations to face and forehead, do not feel pt is safe to d/c home and reqested that Dr. Silverio Lay see him with me, he agrees that pt should see psychiatry in the  AM. Pt is voluntary and agrees to further eval in the AM.  Wounds cleaned, dressed with abx ointment and dressings applied.   Psych hold orders entered.  Pt has some electrolyte abnormalities and elevated AST, ALT, suspect from alcohol abuse.  Pt currently has no complaints, including no chest pain, no shortness of breath, no abdominal pain, no nausea or vomiting. He admits to drinking alcohol earlier in the evening.  EtOH is 54.   He requested family member be updated regarding him staying in the ER for further eval.  Psych hold orders entered, disposition pending psychiatry eval.  Final diagnoses:  Alcohol use disorder (HCC)  Depression  Intentional self-harm by knife, initial encounter [facility])        Danelle Berry, PA-C 08/18/15 1610  Richardean Canal, MD 08/18/15 [phone]

## 2015-08-18 NOTE — ED Notes (Signed)
Attempted to contact pt via phone number [phone], no answer.

## 2015-08-18 NOTE — ED Notes (Signed)
Pt not outside. Charge Nurse made aware

## 2015-08-18 NOTE — ED Notes (Signed)
Breakfast tray ordered 

## 2015-08-18 NOTE — ED Notes (Signed)
Dr Criss AlvineGoldston made aware that pt is not in his room or outside.

## 2015-08-18 NOTE — ED Notes (Signed)
Pt st's he has PTSD and tonight he starting thinking about the past and just started cutting himself.  Pt has multi. Superficial lacs to right abd.  Also scratches across forehead and left cheek.  Pt denies SI or HI

## 2015-08-18 NOTE — ED Provider Notes (Signed)
11:14 AM Nurse informs me that patient apparently walked out. He had apparently told the tech he was going to go smoke. Staff can no longer find the patient. PA note and social work note both agree the patient was not suicidal but he did self inflict these wounds. Patient was waiting for the psychiatrist evaluate him. However it seems like he was not suicidal and can no longer be found.  Pricilla LovelessScott Romain Erion, MD 08/18/15 1115

## 2015-08-18 NOTE — BH Assessment (Signed)
0306:  Tele-assessment delayed, PA-C Angelica Chessmanapia has not seen the Patient.  [id number]:  Schedule tele-assessment.  [id number]:  Patient disposition is pending for consultation between Attending and Extender.  [id number]:  Psychiatric consult called in by Dr. Silverio LayYao.  0435:  Per Extender Maryjean Mornharles Kober:  The Patient does not meet inpatient criteria.

## 2015-08-22 NOTE — Addendum Note (Signed)
Addended by: Mliss FritzKIM, Terald Jump J on: 08/22/2015 01:47 PM   Modules accepted: Orders, Medications

## 2015-08-26 ENCOUNTER — Ambulatory Visit: Payer: Self-pay | Admitting: Pharmacist

## 2015-08-26 ENCOUNTER — Encounter: Payer: Self-pay | Admitting: Internal Medicine

## 2015-08-26 ENCOUNTER — Ambulatory Visit (INDEPENDENT_AMBULATORY_CARE_PROVIDER_SITE_OTHER): Payer: Self-pay | Admitting: Internal Medicine

## 2015-08-26 VITALS — BP 156/114 | HR 100 | Temp 97.6°F | Wt 139.8 lb

## 2015-08-26 DIAGNOSIS — IMO0002 Reserved for concepts with insufficient information to code with codable children: Secondary | ICD-10-CM

## 2015-08-26 DIAGNOSIS — F17210 Nicotine dependence, cigarettes, uncomplicated: Secondary | ICD-10-CM

## 2015-08-26 DIAGNOSIS — H538 Other visual disturbances: Secondary | ICD-10-CM

## 2015-08-26 DIAGNOSIS — E785 Hyperlipidemia, unspecified: Secondary | ICD-10-CM

## 2015-08-26 DIAGNOSIS — F1090 Alcohol use, unspecified, uncomplicated: Secondary | ICD-10-CM

## 2015-08-26 DIAGNOSIS — I10 Essential (primary) hypertension: Secondary | ICD-10-CM

## 2015-08-26 DIAGNOSIS — F1020 Alcohol dependence, uncomplicated: Secondary | ICD-10-CM

## 2015-08-26 DIAGNOSIS — Z0000 Encounter for general adult medical examination without abnormal findings: Secondary | ICD-10-CM

## 2015-08-26 DIAGNOSIS — Z720 Tobacco use: Secondary | ICD-10-CM

## 2015-08-26 NOTE — Assessment & Plan Note (Signed)
Patient has more than 30 pack year smoking history. I counseled on smoking cessation again and patient not interested in cutting down. Not interested in nicoderm and chantix and behavioral intervention.   He qualifies for low dose CT scan based on his age and smoking history. xplained pros and cons and patient would like to proceed with low dose CT scan.  Discussed with Warren Patrick who said the scan is covered in the orange card.  I put the referral in.

## 2015-08-26 NOTE — Patient Instructions (Signed)
Thanks for your visit today Please continue to take the blood pressure medicine and cholesterol medicine Please try to reduce your alcohol use  Please call the number given to obtain free eyeglasses  Someone will call to give you appointment for the image of the lungs for smoking

## 2015-08-26 NOTE — Assessment & Plan Note (Addendum)
Patient is compliant with his atorvastatin.  Plan-continue the above

## 2015-08-26 NOTE — Assessment & Plan Note (Signed)
Patient has finally started taking his HCTZ 25 mg daily and he had the bottle with him which he picked up last month.  His blood pressure initially was 156/114, but repeat one was 140/80.  I also talked with Dr. Elmyra RicksKim's student and they gave him the MAP paperwork to obtain free medications.  He was mildly hypokalemic on the last visit but it was before the hctz was started.  Plan Continue HCTZ 25 mg daily Obtain BMET Given MAP paperwork

## 2015-08-26 NOTE — Progress Notes (Signed)
Patient ID: Warren Patrick, male   DOB: 04/07/1955, 60 y.o.   MRN: [id number]     Subjective:   Patient ID: Warren Jewettichard W [place] male   DOB: 12/03/1955 60 y.o.   MRN: [id number]  HPI: Mr.Everett Lacretia NicksW Georganna SkeansRoscoe is a 60 y.o. man who is here for a follow up visit.    Please see Problem List/A&P for the status of the patient's chronic medical problems      Past Medical History  Diagnosis Date  . HTN (hypertension)   . Hematuria, microscopic   . PTSD (post-traumatic stress disorder)    Current Outpatient Prescriptions  Medication Sig Dispense Refill  . atorvastatin (LIPITOR) 40 MG tablet Take 1 tablet (40 mg total) by mouth daily. 30 tablet 11  . hydrochlorothiazide (HYDRODIURIL) 25 MG tablet Take 25 mg by mouth daily.     No current facility-administered medications for this visit.   Family History  Problem Relation Age of Onset  . Cancer Mother     breast cancer  . Cancer Father     lung cancer  . Cancer Brother     pancreatic cancer  . Hypertension Father   . Diabetes Brother   . Diabetes Sister    Social History   Social History  . Marital Status: Divorced    Spouse Name: N/A  . Number of Children: N/A  . Years of Education: N/A   Social History Main Topics  . Smoking status: Current Every Day Smoker -- 0.50 packs/day    Types: Cigarettes  . Smokeless tobacco: None     Comment: 1/2PPD  . Alcohol Use: 16.8 oz/week    28 Standard drinks or equivalent per week     Comment: Mixed drinks daily.  . Drug Use: No  . Sexual Activity:    Partners: Female   Other Topics Concern  . None   Social History Narrative   Review of Systems:  Constitutional: Negative for fever, chills, weight loss and malaise/fatigue.  HEENT:  Has blurry vision after eyeglasses broke Respiratory: Negative for cough, shortness of breath and wheezing.   Neurological: positive for tingling. Fell once few weeks ago  Objective:  Physical Exam: Filed Vitals:   08/26/15 1326  BP: 156/114    Pulse: 100  Temp: 97.6 F (36.4 C)  TempSrc: Oral  Weight: 139 lb 12.8 oz (63.413 kg)  SpO2: 100%    General: A&O, in NAD  CV: RRR, normal s1, s2, no m/r/g, no carotid bruits appreciated Resp: equal and symmetric breath sounds, no wheezing or crackles  Abdomen: soft, nontender, nondistended,  Skin: warm, dry, intact, no open lesions or rashes noted Extremities: pulses intact b/l, no edema   Assessment & Plan:   Please see problem based charting for assessment and plan

## 2015-08-26 NOTE — Assessment & Plan Note (Signed)
Patient continues to drink 3 whiskeys per day for 3-4 times a week, maybe more as he said depending on his mood. His CAGE is 1/4 positive. HIS AST and ALT were elevated at last CMET.   He is no interested in cutting down this time.

## 2015-08-26 NOTE — Assessment & Plan Note (Signed)
Not interested in colonoscopy this time.

## 2015-08-26 NOTE — Assessment & Plan Note (Signed)
Says his glasses broke and has no money to get new glasses.  Discussed with Ms Mosetta PuttGrady who provided a number to call for social worker who will help obtain free eyeglasses.

## 2015-08-27 LAB — BMP8+ANION GAP
ANION GAP: 23 mmol/L — AB (ref 10.0–18.0)
BUN / CREAT RATIO: 8 — AB (ref 10–24)
BUN: 7 mg/dL — ABNORMAL LOW (ref 8–27)
CO2: 27 mmol/L (ref 18–29)
Calcium: 10 mg/dL (ref 8.6–10.2)
Chloride: 89 mmol/L — ABNORMAL LOW (ref 96–106)
Creatinine, Ser: 0.89 mg/dL (ref 0.76–1.27)
GFR, EST AFRICAN AMERICAN: 107 mL/min/{1.73_m2} (ref >59)
GFR, EST NON AFRICAN AMERICAN: 92 mL/min/{1.73_m2} (ref >59)
Glucose: 96 mg/dL (ref 65–99)
POTASSIUM: 3.3 mmol/L — AB (ref 3.5–5.2)
SODIUM: 139 mmol/L (ref 134–144)

## 2015-08-27 NOTE — Progress Notes (Signed)
Internal Medicine Clinic Attending  Case discussed with Dr. Saraiya at the time of the visit.  We reviewed the resident's history and exam and pertinent patient test results.  I agree with the assessment, diagnosis, and plan of care documented in the resident's note.  

## 2015-08-29 ENCOUNTER — Telehealth: Payer: Self-pay | Admitting: Internal Medicine

## 2015-08-29 MED ORDER — TRIAMTERENE-HCTZ 37.5-25 MG PO TABS: 1.0000 | ORAL_TABLET | Freq: Every day | ORAL | Status: DC

## 2015-08-29 NOTE — Telephone Encounter (Signed)
I called Mr Warren Patrick at 2:10 PM and informed him that he was slightly hypokalemic which was likely due to the HCTZ that he was taking. He denied any symptoms. I asked him to stop the HCTZ and sent the hctz-triamterene to the [facility] pharmacy per request and asked him to obtain it today. Meanwhile I asked him to consume more potassium rich foods like orange juice for 2 days.   I asked him to come back in 2 weeks for a blood pressure re-check and to re-check his BMET.

## 2015-08-29 NOTE — Addendum Note (Signed)
Addended by: Deneise LeverSARAIYA, Maddyson Keil A on: 08/29/2015 02:15 PM   Modules accepted: Orders, Medications

## 2015-08-29 NOTE — Telephone Encounter (Signed)
Made appt 7/3, called lm for pt

## 2015-09-04 ENCOUNTER — Telehealth: Payer: Self-pay

## 2015-09-04 DIAGNOSIS — I10 Essential (primary) hypertension: Secondary | ICD-10-CM

## 2015-09-04 NOTE — Telephone Encounter (Signed)
Called the patient to request [facility]. Patient stated that he was not feeling well for the past few days and attributes symptoms to BP med. I advised the patient to be seen in clinic for his symptoms, he stated he is starting to feel better and will come in if he continues to feel bad. Patient will bring in paperwork on Monday (6/26).

## 2015-09-16 ENCOUNTER — Encounter: Payer: Self-pay | Admitting: Internal Medicine

## 2015-09-20 MED ORDER — ASPIRIN EC 81 MG PO TBEC: 81.0000 mg | DELAYED_RELEASE_TABLET | Freq: Every day | ORAL | Status: DC

## 2015-09-20 MED ORDER — ATORVASTATIN CALCIUM 40 MG PO TABS: 40.0000 mg | ORAL_TABLET | Freq: Every day | ORAL | Status: DC

## 2015-09-20 MED ORDER — TRIAMTERENE-HCTZ 37.5-25 MG PO TABS: 1.0000 | ORAL_TABLET | Freq: Every day | ORAL | Status: DC

## 2015-09-27 ENCOUNTER — Ambulatory Visit: Payer: Self-pay

## 2015-10-04 ENCOUNTER — Ambulatory Visit: Payer: Self-pay

## 2015-10-07 ENCOUNTER — Encounter: Payer: Self-pay | Admitting: Internal Medicine

## 2015-10-07 ENCOUNTER — Ambulatory Visit (INDEPENDENT_AMBULATORY_CARE_PROVIDER_SITE_OTHER): Payer: Self-pay | Admitting: Internal Medicine

## 2015-10-07 VITALS — BP 180/140 | HR 99 | Temp 97.5°F | Wt 137.0 lb

## 2015-10-07 DIAGNOSIS — I10 Essential (primary) hypertension: Secondary | ICD-10-CM

## 2015-10-07 DIAGNOSIS — F17210 Nicotine dependence, cigarettes, uncomplicated: Secondary | ICD-10-CM

## 2015-10-07 DIAGNOSIS — Z79899 Other long term (current) drug therapy: Secondary | ICD-10-CM

## 2015-10-07 MED ORDER — TRIAMTERENE-HCTZ 37.5-25 MG PO TABS: 2.0000 | ORAL_TABLET | Freq: Every day | ORAL | 0 refills | Status: DC

## 2015-10-07 NOTE — Assessment & Plan Note (Addendum)
He is compliant with his hctz-triamterene 25-37.5 mg after that was started last month. He feels well overall. Continues to smoke 1 ppd.  He was slightly hypokalemic at the last visit. He says he does not check BP at home and can not afford BP monitor.  BP was 155/105. Repeat was 180/140  -Obtain BMET -Increas hctz-triamterene to 50-75 -Follow up in 4 weeks to re-assess -Discussed with ms Mosetta Putt - he is not eligible for [facility]. He does go to [facility] frequently- and asked pt to assess BP there. And given a log. -counseled on smoking cessation- he is not ready to quit  Addendum: BMET shows K is normalized at 5

## 2015-10-07 NOTE — Progress Notes (Signed)
Internal Medicine Clinic Attending  Case discussed with Dr. Saraiya soon after the resident saw the patient.  We reviewed the resident's history and exam and pertinent patient test results.  I agree with the assessment, diagnosis, and plan of care documented in the resident's note.  

## 2015-10-07 NOTE — Progress Notes (Signed)
   CC: hypertension HPI: [name] is a 60 y.o. man with PMH noted below here for follow up of his hypertension  Please see Problem List/A&P for the status of the patient's chronic medical problems   Past Medical History:  Diagnosis Date  . Hematuria, microscopic   . HTN (hypertension)   . PTSD (post-traumatic stress disorder)     Review of Systems:  Denies fevers chills Denies headaches, dyspnea Denies dizziness   Physical Exam: Vitals:   10/07/15 0951  BP: (!) 155/105  Pulse: 97  Temp: 97.5 F (36.4 C)  TempSrc: Oral  Weight: 137 lb (62.1 kg)    General: A&O, in NAD CV: RRR, normal s1, s2 Resp: equal and symmetric breath sounds, no wheezing or crackles  Abdomen: soft, nontender, nondistended, +BS    Assessment & Plan:   See encounters tab for problem based medical decision making. Patient discussed with Dr. Cleda Daub

## 2015-10-07 NOTE — Patient Instructions (Signed)
Thank you for your visit today Please take 2 tablets of hydrochlorothiazide-triamterene daily. Please follow up in 2-4 weeks I will call you if any changes need to be made

## 2015-10-08 LAB — BMP8+ANION GAP
ANION GAP: 19 mmol/L — AB (ref 10.0–18.0)
BUN/Creatinine Ratio: 13 (ref 10–24)
BUN: 13 mg/dL (ref 8–27)
CALCIUM: 10.4 mg/dL — AB (ref 8.6–10.2)
CO2: 29 mmol/L (ref 18–29)
Chloride: 86 mmol/L — ABNORMAL LOW (ref 96–106)
Creatinine, Ser: 1.01 mg/dL (ref 0.76–1.27)
GFR calc Af Amer: 92 mL/min/{1.73_m2} (ref >59)
GFR, EST NON AFRICAN AMERICAN: 80 mL/min/{1.73_m2} (ref >59)
Glucose: 80 mg/dL (ref 65–99)
Potassium: 5 mmol/L (ref 3.5–5.2)
Sodium: 134 mmol/L (ref 134–144)

## 2015-11-01 ENCOUNTER — Telehealth: Payer: Self-pay | Admitting: Internal Medicine

## 2015-11-01 NOTE — Telephone Encounter (Signed)
APT. REMINDER CALL, LMTCB °

## 2015-11-04 ENCOUNTER — Ambulatory Visit (INDEPENDENT_AMBULATORY_CARE_PROVIDER_SITE_OTHER): Payer: Self-pay | Admitting: Internal Medicine

## 2015-11-04 DIAGNOSIS — R296 Repeated falls: Secondary | ICD-10-CM

## 2015-11-04 DIAGNOSIS — F17200 Nicotine dependence, unspecified, uncomplicated: Secondary | ICD-10-CM

## 2015-11-04 DIAGNOSIS — I10 Essential (primary) hypertension: Secondary | ICD-10-CM

## 2015-11-04 DIAGNOSIS — Z9181 History of falling: Secondary | ICD-10-CM

## 2015-11-04 DIAGNOSIS — Z720 Tobacco use: Secondary | ICD-10-CM

## 2015-11-04 DIAGNOSIS — F322 Major depressive disorder, single episode, severe without psychotic features: Secondary | ICD-10-CM

## 2015-11-04 DIAGNOSIS — W19XXXA Unspecified fall, initial encounter: Secondary | ICD-10-CM | POA: Insufficient documentation

## 2015-11-04 DIAGNOSIS — G4700 Insomnia, unspecified: Secondary | ICD-10-CM

## 2015-11-04 MED ORDER — BUPROPION HCL ER (XL) 150 MG PO TB24: 150.0000 mg | ORAL_TABLET | Freq: Every day | ORAL | 1 refills | Status: DC

## 2015-11-04 NOTE — Assessment & Plan Note (Signed)
Blood pressure was 132/99 and at goal. He is taking the hctz-triamterene 50-75 daily  -continue hctz triamterene 50-75 daily

## 2015-11-04 NOTE — Patient Instructions (Signed)
Thank you for your visit today  Please start taking the Wellbutryin medicine daily for your depression  I have referred you to the physical therapy- they will call you and come to your house   Please follow up in 4-6 weeks

## 2015-11-04 NOTE — Assessment & Plan Note (Addendum)
Patient had main complaint of insomnia today. He mentions that for many months it has been difficult for him to both fall asleep and stay asleep. He would sleep for 3-4 hours. For example, yesterday he went to bed at 1:20 AM, and woke up at 5:20 AM and could not fall back asleep. He practices good sleep hygiene and turns the lights off before bedtime and does not watch TV before sleeping.  He denies napping during the day but does feel tired during the day. He denies caffeine use but does drink mountain dew.  He says he took several of his wife's trazodone pills which helped him with the sleep somewhat.  On obtaining the PHQ9 scale, his score was 21 indicating severe depression. He later mentioned that he was depressed after he stopped working 2 years ago and he used to be in [employer]. His spouse also has several health issues.  His depression may be causing the insomnia.  A: severe depression affecting his sleep  P: -Counseled again on sleep hygiene including avoiding any form of caffeine -start bupropion 150 mg ER daily -f/u in 4-6 weeks

## 2015-11-04 NOTE — Progress Notes (Signed)
   CC: depression, insomnia, falls, HTN, HM HPI: [name] is a 60 y.o. with PMH noted below here for depression, insomnia, falls, HTN, HM  Please see Problem List/A&P for the status of the patient's chronic medical problems   Past Medical History:  Diagnosis Date  . Hematuria, microscopic   . HTN (hypertension)   . PTSD (post-traumatic stress disorder)     Review of Systems: Denies fevers ,fatigue, wgt loss Denies SOB Denies n/v/abd pain Denies joint pain. Has fallen 3 times Denies numbness or tingling Feels weak sometimes and dizzy when gets up suddenly   Physical Exam: There were no vitals filed for this visit.  General: A&O, in NAD, using a cane CV: RRR, normal s1, s2, no m/r/g Resp: equal and symmetric breath sounds, no wheezing or crackles  Abdomen: soft, nontender, nondistended, +BS Extremities: no edema and normal pulse Neurologic: no focal neurological deficits   Assessment & Plan:   See encounters tab for problem based medical decision making. Patient discussed with Dr. Rogelia BogaButcher

## 2015-11-04 NOTE — Assessment & Plan Note (Signed)
Pt says that he has fallen several times in the last few months, both before the antihypertensive was started, and after the antihypertensive was started. Most recent one was last week when he fell when climbing up to his porch and fell on his right hip.  He denies any LOC or head injury. He says that he feels unstable sometimes even with the cane. He also mentions that he feels dizzy when he gets up suddenly from lying to standing position. He does have blurry vision and still has not gotten the free eyeglasses that we gave him the info last time.  On exam: Orthostatics were negative.  Lying 128/95, sitting 121/109, and standing 128/103 and standing after 3 minutes 133/102.   I did observe unsteady gait when he was climbing up and down from the exam table and some tremor of his hand.  A: most likely gait instability leading to mechanical fall. Anti-HTN and orthostatics are unlikely to be the cause  -He would qualify for home health PT- and placed the referral

## 2015-11-05 NOTE — Progress Notes (Signed)
Internal Medicine Clinic Attending  Case discussed with Dr. Saraiya soon after the resident saw the patient.  We reviewed the resident's history and exam and pertinent patient test results.  I agree with the assessment, diagnosis, and plan of care documented in the resident's note.  

## 2015-11-07 ENCOUNTER — Telehealth: Payer: Self-pay | Admitting: Licensed Clinical Social Worker

## 2015-11-07 NOTE — Addendum Note (Signed)
Addended by: Deneise LeverSARAIYA, Colene [place] A on: 11/07/2015 04:29 PM   Modules accepted: Orders

## 2015-11-07 NOTE — Assessment & Plan Note (Signed)
Per Ms Mosetta PuttGrady, the scan may not be covered by orange card as our office does not determine that.

## 2015-11-07 NOTE — Telephone Encounter (Addendum)
CSW placed called to pt.  CSW left message requesting return call. CSW provided contact hours and phone number.  Warren Patrick was referred for home health services; however, pt does not have a payor source.  CSW will inquire if pt would like to private pay for services.  CSW will inquire if Warren Patrick is open to counseling/behavioral health services.

## 2015-11-07 NOTE — Addendum Note (Signed)
Addended by: Neomia DearPOWERS, Acy Orsak E on: 11/07/2015 02:43 PM   Modules accepted: Orders

## 2015-11-11 NOTE — Addendum Note (Signed)
Addended by: Neomia DearPOWERS, Shailee Foots E on: 11/11/2015 06:01 PM   Modules accepted: Orders

## 2015-11-11 NOTE — Telephone Encounter (Signed)
CSW placed call to Warren Patrick.  Pt informed regarding [facility] of [facility] source [facility].  Mr. 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  Warren Patrick currently hospitalized but planning to d/c to home today.  Mr. Warren Patrick states he will be consumed with provided her care and will have little time [facility].  CSW inquired if pt would be interested [facility], pt states he has not worked in over 2 years due to rotator cuff injury.  Pt states he has had increased falls and is applying for SS Disability.  CSW inquired if Mr. Warren Patrick would be interested in Outpatient PT as CFA/GCCN may cover this service.  Mr. Warren Patrick states he is not interested at this time but aware it is an option.  CSW encouraged Mr. Warren Patrick to notify PCP when he is ready for outpatient services.  Conversation did not lead way to discuss mental health services, CSW will mail information to [name].

## 2015-11-20 ENCOUNTER — Encounter: Payer: Self-pay | Admitting: *Deleted

## 2015-11-25 ENCOUNTER — Encounter: Payer: Self-pay | Admitting: Internal Medicine

## 2015-11-26 ENCOUNTER — Emergency Department (HOSPITAL_COMMUNITY)
Admission: EM | Admit: 2015-11-26 | Discharge: 2015-11-27 | Disposition: A | Discharge status: Home / Self Care | Payer: Self-pay | Attending: Emergency Medicine | Admitting: Emergency Medicine

## 2015-11-26 ENCOUNTER — Encounter (HOSPITAL_COMMUNITY): Payer: Self-pay

## 2015-11-26 DIAGNOSIS — Z23 Encounter for immunization: Secondary | ICD-10-CM | POA: Insufficient documentation

## 2015-11-26 DIAGNOSIS — F17210 Nicotine dependence, cigarettes, uncomplicated: Secondary | ICD-10-CM | POA: Insufficient documentation

## 2015-11-26 DIAGNOSIS — IMO0002 Reserved for concepts with insufficient information to code with codable children: Secondary | ICD-10-CM

## 2015-11-26 DIAGNOSIS — I10 Essential (primary) hypertension: Secondary | ICD-10-CM | POA: Insufficient documentation

## 2015-11-26 DIAGNOSIS — Y92000 Kitchen of unspecified non-institutional (private) residence as  the place of occurrence of the external cause: Secondary | ICD-10-CM | POA: Insufficient documentation

## 2015-11-26 DIAGNOSIS — S61412A Laceration without foreign body of left hand, initial encounter: Secondary | ICD-10-CM | POA: Insufficient documentation

## 2015-11-26 DIAGNOSIS — W260XXA Contact with knife, initial encounter: Secondary | ICD-10-CM | POA: Insufficient documentation

## 2015-11-26 DIAGNOSIS — S60221A Contusion of right hand, initial encounter: Secondary | ICD-10-CM | POA: Insufficient documentation

## 2015-11-26 DIAGNOSIS — Y939 Activity, unspecified: Secondary | ICD-10-CM | POA: Insufficient documentation

## 2015-11-26 DIAGNOSIS — Z7982 Long term (current) use of aspirin: Secondary | ICD-10-CM | POA: Insufficient documentation

## 2015-11-26 DIAGNOSIS — Y999 Unspecified external cause status: Secondary | ICD-10-CM | POA: Insufficient documentation

## 2015-11-26 MED ORDER — TETANUS-DIPHTHERIA TOXOIDS TD 5-2 LFU IM INJ
0.5000 mL | INJECTION | Freq: Once | INTRAMUSCULAR | Status: AC
  Administered 2015-11-27: 0.5 mL via INTRAMUSCULAR
  Filled 2015-11-26: qty 0.5

## 2015-11-26 MED ORDER — LIDOCAINE HCL (PF) 1 % IJ SOLN
5.0000 mL | Freq: Once | INTRAMUSCULAR | Status: AC
  Administered 2015-11-27: 5 mL
  Filled 2015-11-26: qty 5

## 2015-11-26 NOTE — ED Triage Notes (Signed)
Pt states slipped in kitchen, cut L arm on knife in dishwasher. Denies any head trauma/injury. Pt complaining of L arm pain and R hand pain. Pt states attempted to break fall with R hand.

## 2015-11-26 NOTE — ED Triage Notes (Signed)
Pt admits to 2 drinks this evening.

## 2015-11-26 NOTE — ED Provider Notes (Signed)
[facility]-EMERGENCY DEPT Provider Note   CSN: [id number] Arrival date & time: 11/26/15  2310  By signing my name below, I, Warren Patrick, attest that this documentation has been prepared under the direction and in the presence of non-physician practitioner, Kerrie Buffalo, NP. Electronically Signed: Modena Patrick, Scribe. 11/26/2015. 11:36 PM.  History   Chief Complaint Chief Complaint  Patient presents with  . Extremity Laceration   The history is provided by the patient. No language interpreter was used.  Laceration   The incident occurred 3 to 5 hours ago. The laceration is located on the left hand. The laceration mechanism was a a clean knife. The pain is moderate. The pain has been constant since onset. He reports no foreign bodies present. His tetanus status is unknown.   HPI Comments: Warren Patrick is a 60 y.o. male who presents to the Emergency Department complaining of a left hand wound that occurred today. He slipped, his left hand hit a knife in the kitchen, and his right hand hit the ground. He is unsure of his tetanus status. He currently on medication for HTN and hyperlipidemia. He denies any other complaints.  Past Medical History:  Diagnosis Date  . Hematuria, microscopic   . HTN (hypertension)   . PTSD (post-traumatic stress disorder)     Patient Active Problem List   Diagnosis Date Noted  . Falls 11/04/2015  . Vision blurred 08/26/2015  . Alcohol use disorder (HCC) 07/22/2015  . Hyperlipidemia 07/22/2015  . Severe depression 07/22/2015  . HTN (hypertension) 05/23/2015  . Tobacco abuse 05/23/2015  . Right shoulder pain 05/23/2015  . Healthcare maintenance 05/23/2015  . Insomnia 05/23/2015    Past Surgical History:  Procedure Laterality Date  . ROTATOR CUFF REPAIR     right shoulder, on 10/2013       Home Medications    Prior to Admission medications   Medication Sig Start Date End Date Taking? Authorizing Provider  aspirin EC 81 MG tablet Take 1 tablet  (81 mg total) by mouth daily. Patient not taking: Reported on 11/04/2015 09/20/15 09/19/16  Deneise Lever, MD  atorvastatin (LIPITOR) 40 MG tablet Take 1 tablet (40 mg total) by mouth daily. 09/20/15   Deneise Lever, MD  buPROPion (WELLBUTRIN XL) 150 MG 24 hr tablet Take 1 tablet (150 mg total) by mouth daily. 11/04/15   Deneise Lever, MD  triamterene-hydrochlorothiazide (MAXZIDE-25) 37.5-25 MG tablet Take 2 tablets by mouth daily. 10/07/15   Deneise Lever, MD    Family History Family History  Problem Relation Age of Onset  . Cancer Mother     breast cancer  . Cancer Father     lung cancer  . Hypertension Father   . Cancer Brother     pancreatic cancer  . Diabetes Brother   . Diabetes Sister     Social History Social History  Substance Use Topics  . Smoking status: Current Every Day Smoker    Packs/day: 0.50    Types: Cigarettes  . Smokeless tobacco: Never Used     Comment: 1/2PPD  . Alcohol use 16.8 oz/week    28 Standard drinks or equivalent per week     Comment: Mixed drinks daily.     Allergies   Bee venom   Review of Systems Review of Systems  Musculoskeletal: Positive for arthralgias and myalgias.  Skin: Positive for wound.  all other systems negative   Physical Exam Updated Vital Signs BP 158/100 (BP Location: Right Arm)   Pulse 87   Temp  98.5 F (36.9 C) (Oral)   Resp 18   SpO2 95%   Physical Exam  Constitutional: He appears well-developed and well-nourished. No distress.  HENT:  Head: Normocephalic and atraumatic.  Eyes: Conjunctivae are normal.  Neck: Neck supple.  Cardiovascular: Normal rate.   Pulmonary/Chest: Effort normal.  Abdominal: Soft.  Musculoskeletal: Normal range of motion.  Neurological: He is alert.  Skin: Skin is warm and dry.  Radial pulses are +2. Adequate ciruclation. Full ROM. Swelling at the bases of the right middle and ring finger.  6cm laceration ot the left mid forearm, gaping wound. Bleeding controlled.   Psychiatric: He  has a normal mood and affect.  Nursing note and vitals reviewed.    ED Treatments / Results  DIAGNOSTIC STUDIES: Oxygen Saturation is 95% on RA, normal by my interpretation.    COORDINATION OF CARE: 11:40 PM- Pt advised of plan for treatment and pt agrees.  Labs (all labs ordered are listed, but only abnormal results are displayed) Labs Reviewed - No data to display  Radiology Dg Hand Complete Right  Result Date: 11/27/2015 CLINICAL DATA:  Larey SeatFell at home, striking hand. EXAM: RIGHT HAND - COMPLETE 3+ VIEW COMPARISON:  None. FINDINGS: There is no evidence of fracture or dislocation. There is no evidence of arthropathy or other focal bone abnormality. There is soft tissue swelling at the dorsum of the hand. No soft tissue foreign body is evident. IMPRESSION: Negative for fracture, dislocation or radiopaque foreign body. Electronically Signed   By: Ellery Plunkaniel R Mitchell M.D.   On: 11/27/2015 00:12    Procedures .Marland Kitchen.Laceration Repair Date/Time: 11/28/2015 1:43 AM Performed by: Janne NapoleonNEESE, Dijon Kohlman M Authorized by: Janne NapoleonNEESE, Omari Mcmanaway M   Consent:    Consent obtained:  Verbal   Consent given by:  Patient   Risks discussed:  Infection   Alternatives discussed:  No treatment Anesthesia (see MAR for exact dosages):    Anesthesia method:  Local infiltration   Local anesthetic:  Lidocaine 1% w/o epi Laceration details:    Location:  Shoulder/arm   Shoulder/arm location:  L lower arm   Length (cm):  6 Repair type:    Repair type:  Simple Pre-procedure details:    Preparation:  Patient was prepped and draped in usual sterile fashion Exploration:    Hemostasis achieved with:  Direct pressure   Wound exploration: wound explored through full range of motion and entire depth of wound probed and visualized     Wound extent: no foreign bodies/material noted and no tendon damage noted     Contaminated: no   Treatment:    Area cleansed with:  Betadine and saline   Amount of cleaning:  Standard   Irrigation  solution:  Sterile saline   Irrigation volume:  1000   Irrigation method:  Syringe Skin repair:    Repair method:  Staples   Number of staples:  5 Approximation:    Approximation:  Close   Vermilion border: well-aligned   Post-procedure details:    Dressing:  Antibiotic ointment and sterile dressing   Patient tolerance of procedure:  Tolerated well, no immediate complications   (including critical care time)  Medications Ordered in ED Medications  lidocaine (PF) (XYLOCAINE) 1 % injection 5 mL (5 mLs Infiltration Given 11/27/15 0012)  tetanus & diphtheria toxoids (adult) (TENIVAC) injection 0.5 mL (0.5 mLs Intramuscular Given 11/27/15 0014)     Initial Impression / Assessment and Plan / ED Course  I have reviewed the triage vital signs and the nursing notes.  Pertinent imaging results that were available during my care of the patient were reviewed by me and considered in my medical decision making (see chart for details).  Clinical Course    Tdap booster given.Pressure irrigation performed. Laceration occurred < 8 hours prior to repair which was well tolerated. Pt has no co morbidities to effect normal wound healing. Discussed suture home care w pt and answered questions. Pt to f-u for wound check and suture removal in 7 days. Pt is hemodynamically stable w no complaints prior to dc.    Final Clinical Impressions(s) / ED Diagnoses   Final diagnoses:  Laceration  Contusion, hand, right, initial encounter    New Prescriptions Discharge Medication List as of 11/27/2015 12:18 AM     I personally performed the services described in this documentation, which was scribed in my presence. The recorded information has been reviewed and is accurate.     [address] [place], NP 11/28/15 0149    Dione Booze, MD 11/28/15 [phone]

## 2015-11-27 ENCOUNTER — Emergency Department (HOSPITAL_COMMUNITY): Payer: Self-pay

## 2015-12-02 ENCOUNTER — Encounter: Payer: Self-pay | Admitting: Internal Medicine

## 2015-12-09 ENCOUNTER — Encounter: Payer: Self-pay | Admitting: Internal Medicine

## 2015-12-11 ENCOUNTER — Telehealth: Payer: Self-pay | Admitting: Internal Medicine

## 2015-12-11 NOTE — Telephone Encounter (Signed)
APT. REMINDER CALL, LMTCB °

## 2015-12-12 ENCOUNTER — Telehealth: Payer: Self-pay | Admitting: *Deleted

## 2015-12-12 ENCOUNTER — Ambulatory Visit: Payer: Self-pay

## 2015-12-12 NOTE — Telephone Encounter (Signed)
[facility] PT presents wanting staples removed, see last ED note, spoke to dr Heide Sparknarendra, will schedule for an open slot, none today [facility] 10/2 [facility]

## 2015-12-13 ENCOUNTER — Telehealth: Payer: Self-pay | Admitting: Internal Medicine

## 2015-12-13 NOTE — Telephone Encounter (Signed)
APT. REMINDER CALL, LMTCB °

## 2015-12-16 ENCOUNTER — Ambulatory Visit: Payer: Self-pay

## 2015-12-18 ENCOUNTER — Emergency Department (HOSPITAL_COMMUNITY)
Admission: EM | Admit: 2015-12-18 | Discharge: 2015-12-18 | Disposition: A | Discharge status: Home / Self Care | Payer: Self-pay | Attending: Emergency Medicine | Admitting: Emergency Medicine

## 2015-12-18 ENCOUNTER — Encounter (HOSPITAL_COMMUNITY): Payer: Self-pay | Admitting: Emergency Medicine

## 2015-12-18 DIAGNOSIS — Y929 Unspecified place or not applicable: Secondary | ICD-10-CM | POA: Insufficient documentation

## 2015-12-18 DIAGNOSIS — Z7982 Long term (current) use of aspirin: Secondary | ICD-10-CM | POA: Insufficient documentation

## 2015-12-18 DIAGNOSIS — T2111XA Burn of first degree of chest wall, initial encounter: Secondary | ICD-10-CM | POA: Insufficient documentation

## 2015-12-18 DIAGNOSIS — I10 Essential (primary) hypertension: Secondary | ICD-10-CM | POA: Insufficient documentation

## 2015-12-18 DIAGNOSIS — Z79899 Other long term (current) drug therapy: Secondary | ICD-10-CM | POA: Insufficient documentation

## 2015-12-18 DIAGNOSIS — F17210 Nicotine dependence, cigarettes, uncomplicated: Secondary | ICD-10-CM | POA: Insufficient documentation

## 2015-12-18 DIAGNOSIS — T300 Burn of unspecified body region, unspecified degree: Secondary | ICD-10-CM

## 2015-12-18 DIAGNOSIS — Y999 Unspecified external cause status: Secondary | ICD-10-CM | POA: Insufficient documentation

## 2015-12-18 DIAGNOSIS — X100XXA Contact with hot drinks, initial encounter: Secondary | ICD-10-CM | POA: Insufficient documentation

## 2015-12-18 DIAGNOSIS — Y939 Activity, unspecified: Secondary | ICD-10-CM | POA: Insufficient documentation

## 2015-12-18 HISTORY — DX: Malingerer (conscious simulation): Z76.5

## 2015-12-18 HISTORY — DX: Alcohol abuse, uncomplicated: F10.10

## 2015-12-18 MED ORDER — MORPHINE SULFATE (PF) 4 MG/ML IV SOLN
4.0000 mg | Freq: Once | INTRAVENOUS | Status: AC
  Administered 2015-12-18: 4 mg via INTRAVENOUS
  Filled 2015-12-18: qty 1

## 2015-12-18 MED ORDER — KETOROLAC TROMETHAMINE 30 MG/ML IJ SOLN
30.0000 mg | Freq: Once | INTRAMUSCULAR | Status: AC
  Administered 2015-12-18: 30 mg via INTRAVENOUS
  Filled 2015-12-18: qty 1

## 2015-12-18 MED ORDER — HYDROCODONE-ACETAMINOPHEN 5-325 MG PO TABS: 1.0000 | ORAL_TABLET | ORAL | 0 refills | Status: DC | PRN

## 2015-12-18 MED ORDER — SILVER SULFADIAZINE 1 % EX CREA: 1.0000 "application " | TOPICAL_CREAM | Freq: Every day | CUTANEOUS | 0 refills | Status: DC

## 2015-12-18 MED ORDER — SILVER SULFADIAZINE 1 % EX CREA
TOPICAL_CREAM | Freq: Once | CUTANEOUS | Status: AC
  Administered 2015-12-18: via TOPICAL
  Filled 2015-12-18: qty 85
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
## 2015-12-18 NOTE — Discharge Instructions (Signed)
Change dressing once a day. Take ibuprofen or naproxen as needed for less severe pain.
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
## 2016-01-03 ENCOUNTER — Encounter: Payer: Self-pay | Admitting: *Deleted

## 2016-01-06 ENCOUNTER — Telehealth: Payer: Self-pay

## 2016-01-09 ENCOUNTER — Other Ambulatory Visit: Payer: Self-pay | Admitting: Pharmacist

## 2016-01-09 DIAGNOSIS — Z720 Tobacco use: Secondary | ICD-10-CM

## 2016-01-09 MED ORDER — NICOTINE 10 MG IN INHA: 1.0000 | RESPIRATORY_TRACT | 0 refills | Status: DC | PRN

## 2016-01-09 MED ORDER — NICOTINE 10 MG IN INHA: 1.0000 | RESPIRATORY_TRACT | 3 refills | Status: DC | PRN

## 2016-01-09 MED ORDER — ASPIRIN EC 81 MG PO TBEC: 81.0000 mg | DELAYED_RELEASE_TABLET | Freq: Every day | ORAL | 2 refills | Status: AC

## 2016-01-09 NOTE — Progress Notes (Signed)
Facilitated Nicotrol prescription as approved by PCP, patient education provided.

## 2016-01-17 NOTE — Progress Notes (Signed)
Patient was contacted with Frank Tillman, PharmD candidate. I agree with the assessment and plan of care documented.  

## 2016-01-20 ENCOUNTER — Ambulatory Visit: Payer: Self-pay

## 2016-01-20 ENCOUNTER — Encounter: Payer: Self-pay | Admitting: Internal Medicine

## 2016-01-22 ENCOUNTER — Telehealth: Payer: Self-pay

## 2016-01-22 NOTE — Telephone Encounter (Signed)
[facility], could be medication induced.  Thanks
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
## 2016-01-24 ENCOUNTER — Telehealth: Payer: Self-pay | Admitting: Internal Medicine

## 2016-01-24 NOTE — Telephone Encounter (Signed)
APT. REMINDER CALL, LMTCB °

## 2016-01-27 ENCOUNTER — Ambulatory Visit: Payer: Self-pay

## 2016-01-27 ENCOUNTER — Ambulatory Visit (INDEPENDENT_AMBULATORY_CARE_PROVIDER_SITE_OTHER): Payer: Self-pay | Admitting: Internal Medicine

## 2016-01-27 VITALS — BP 150/100 | HR 90 | Temp 97.8°F | Ht 69.0 in | Wt 143.6 lb

## 2016-01-27 DIAGNOSIS — R220 Localized swelling, mass and lump, head: Secondary | ICD-10-CM

## 2016-01-27 DIAGNOSIS — Z79899 Other long term (current) drug therapy: Secondary | ICD-10-CM

## 2016-01-27 DIAGNOSIS — I10 Essential (primary) hypertension: Secondary | ICD-10-CM

## 2016-01-27 DIAGNOSIS — F17210 Nicotine dependence, cigarettes, uncomplicated: Secondary | ICD-10-CM

## 2016-01-27 MED ORDER — AMLODIPINE BESYLATE 5 MG PO TABS: 5.0000 mg | ORAL_TABLET | Freq: Every day | ORAL | 3 refills | Status: DC

## 2016-01-27 MED ORDER — TRIAMTERENE-HCTZ 37.5-25 MG PO TABS: 2.0000 | ORAL_TABLET | Freq: Every day | ORAL | 0 refills | Status: DC

## 2016-01-27 NOTE — Progress Notes (Signed)
    CC: HTN, swelling of face  HPI: [name] is a 60 y.o. man with PMH noted below here for HTN and complaint of swelling of face   Please see Problem List/A&P for the status of the patient's chronic medical problems   Past Medical History:  Diagnosis Date  . Drug-seeking behavior   . ETOH abuse   . Hematuria, microscopic   . HTN (hypertension)   . PTSD (post-traumatic stress disorder)     Review of Systems:  Constitutional: Negative for fever, chills, weight loss and malaise/fatigue.  HEENT: No headaches, vision problems, cough, hearing problems  Respiratory: Negative for cough, shortness of breath and wheezing.                     Episodic swelling of face, lips and tongue once every 2-3 weeks for 3 months.                  Denies current swelling of face, tongue or lips, or difficulty breathing or swallowing Gastrointestinal: Negative for nausea, vomiting, abdominal pain, diarrhea  Physical Exam: Vitals:   01/27/16 1527 01/27/16 1626  BP: (!) 158/99 (!) 150/100  Pulse: 90 90  Temp: 97.8 F (36.6 C)   TempSrc: Oral   SpO2: 100%   Weight: 143 lb 9.6 oz (65.1 kg)   Height: 5\' 9"  (1.753 m)     General: A&O, in NAD, unsteady gait  HEENT: EOMI,  MMM, no swelling of lips, tongue or face appreciated, patent airway ,mallampati class 2 Neck: supple, midline trachea,  CV: RRR, normal s1, s2, no m/r/g,  Resp: equal and symmetric breath sounds, no wheezing or crackles  Abdomen: soft, nontender, nondistended, +BS   Assessment & Plan:   See encounters tab for problem based medical decision making. Patient discussed with Dr. Criselda PeachesMullen

## 2016-01-27 NOTE — Patient Instructions (Signed)
Thank you for your visit today  Please stop the Bupropion. Please let us know if you continue to experience swelling of your face and we may have to investigate further.   Please start the amlodipine   Please follow up in 1 month

## 2016-01-27 NOTE — Assessment & Plan Note (Signed)
Patient says that after the Wellbutrin was started, he started experiencing swelling of tongue, lips, eyes, and face about once every 2-3 weeks, for about 3 months and the episodes would last 1-2 days and resolve. He currently denies any of the symptoms. He denies any exposure to allergens like mites etc. Denies rash.  Review of lit indicates that <1% chance of facial edema happens in Wellbutrin, but since there is a temporal relationship, we will stop the wellbutrin before pursuing further workup.  Plan -stop wellbutrin -Asked pt to inform us if he continues to experience these symptoms, and may need workup for angioedema, like C1 and C4 esterase levels, etc, and/or antihistamine treatment

## 2016-01-27 NOTE — Assessment & Plan Note (Signed)
BP Readings from Last 3 Encounters:  01/27/16 (!) 150/100  12/18/15 166/99  11/26/15 158/100   Repeat blood pressure was 150/100. While systolic BP is at goal (<150), he has been slightly hypertensive since his prior visits and at this point I will add amlodipine to his hctz-triamterene to which he is compliant to.  Plan -continue hctz-triamterene -amlodipine 5 mg daily -BMET today -follow up in 1-2 months

## 2016-01-28 LAB — BMP8+ANION GAP
Anion Gap: 20 mmol/L — ABNORMAL HIGH (ref 10.0–18.0)
BUN / CREAT RATIO: 11 (ref 10–24)
BUN: 14 mg/dL (ref 8–27)
CALCIUM: 10.5 mg/dL — AB (ref 8.6–10.2)
CHLORIDE: 88 mmol/L — AB (ref 96–106)
CO2: 25 mmol/L (ref 18–29)
Creatinine, Ser: 1.24 mg/dL (ref 0.76–1.27)
GFR, EST AFRICAN AMERICAN: 72 mL/min/{1.73_m2} (ref >59)
GFR, EST NON AFRICAN AMERICAN: 62 mL/min/{1.73_m2} (ref >59)
GLUCOSE: 94 mg/dL (ref 65–99)
POTASSIUM: 4.4 mmol/L (ref 3.5–5.2)
Sodium: 133 mmol/L — ABNORMAL LOW (ref 134–144)

## 2016-01-31 NOTE — Progress Notes (Signed)
Internal Medicine Clinic Attending  Case discussed with Dr. Saraiya at the time of the visit.  We reviewed the resident's history and exam and pertinent patient test results.  I agree with the assessment, diagnosis, and plan of care documented in the resident's note.  

## 2016-03-23 ENCOUNTER — Encounter: Payer: Self-pay | Admitting: Internal Medicine

## 2016-04-13 ENCOUNTER — Encounter: Payer: Self-pay | Admitting: Internal Medicine

## 2016-04-13 ENCOUNTER — Ambulatory Visit (INDEPENDENT_AMBULATORY_CARE_PROVIDER_SITE_OTHER): Payer: Self-pay | Admitting: Internal Medicine

## 2016-04-13 DIAGNOSIS — F322 Major depressive disorder, single episode, severe without psychotic features: Secondary | ICD-10-CM

## 2016-04-13 DIAGNOSIS — F332 Major depressive disorder, recurrent severe without psychotic features: Secondary | ICD-10-CM

## 2016-04-13 DIAGNOSIS — Z79899 Other long term (current) drug therapy: Secondary | ICD-10-CM

## 2016-04-13 DIAGNOSIS — Z23 Encounter for immunization: Secondary | ICD-10-CM

## 2016-04-13 DIAGNOSIS — F17210 Nicotine dependence, cigarettes, uncomplicated: Secondary | ICD-10-CM

## 2016-04-13 DIAGNOSIS — Z0000 Encounter for general adult medical examination without abnormal findings: Secondary | ICD-10-CM

## 2016-04-13 DIAGNOSIS — I10 Essential (primary) hypertension: Secondary | ICD-10-CM

## 2016-04-13 MED ORDER — ESCITALOPRAM OXALATE 10 MG PO TABS: 10.0000 mg | ORAL_TABLET | Freq: Every day | ORAL | 0 refills | Status: DC

## 2016-04-13 MED ORDER — TRIAMTERENE-HCTZ 37.5-25 MG PO TABS: 2.0000 | ORAL_TABLET | Freq: Every day | ORAL | 2 refills | Status: DC

## 2016-04-13 NOTE — Assessment & Plan Note (Signed)
BP Readings from Last 3 Encounters:  04/13/16 (!) 143/89  01/27/16 (!) 150/100  12/18/15 166/99   Initially pt said he was taking both hctz-triamterene and amlodipine, but later said that he was out of hctz-triamterene.  Blood pressure was mildly elevated.  Plan -continue amlodipine and hctz-triamterene which was refilled -follow up in 3 months

## 2016-04-13 NOTE — Assessment & Plan Note (Signed)
Pt received flu shot today Stool cards were negative in March 2017 as pt not interested in colonoscopy

## 2016-04-13 NOTE — Patient Instructions (Signed)
Thank you for your visit today  Please continue your blood pressure medicines.  Please start taking lexapro daily for your depression  Please follow up in 4-6 weeks for evaluation of your depression

## 2016-04-13 NOTE — Progress Notes (Signed)
   CC: HTN, depression, HM HPI: Mr.Etheridge W Georganna SkeansRoscoe is a 61 y.o. man with PMH noted below here for HTN, depression, HM  Please see Problem List/A&P for the status of the patient's chronic medical problems   Past Medical History:  Diagnosis Date  . Drug-seeking behavior   . ETOH abuse   . Hematuria, microscopic   . HTN (hypertension)   . PTSD (post-traumatic stress disorder)     Review of Systems:  Constitutional: Negative for fever, chills, weight loss and malaise/fatigue. Endorses continuing depression   Respiratory: Negative for cough, shortness of breath and wheezing.  Cardiovascular: Negative for chest pain, orthopnea, or PND   Gastrointestinal: Negative for heartburn, nausea, vomiting, abdominal pain, diarrhea Neurological: has fallen once since last seen. And dropped a can of milk   Physical Exam: Vitals:   04/13/16 1548  BP: (!) 143/89  Pulse: 80  Temp: 98 F (36.7 C)  SpO2: 100%  Weight: 145 lb 3.2 oz (65.9 kg)  Height: 5\' 9"  (1.753 m)    General: A&O, in NAD, with cane Neck: supple, midline trachea,  CV: RRR, normal s1, s2, no m/r/g,  Resp: equal and symmetric breath sounds, no wheezing or crackles  Abdomen: soft, nontender, nondistended, +BS Extremities: no b/l edema   Assessment & Plan:   See encounters tab for problem based medical decision making. Patient discussed with Dr. Heide SparkNarendra

## 2016-04-13 NOTE — Assessment & Plan Note (Signed)
Pt has been off of Wellbutrin due to swelling of face. Still endorses significant depression. PHQ 9 was a score of 20. Says he has associated insomnia, and he has lost interest in doing activities he enjoys like fishing.  Plan -start lexapro daily -follow up in 4-6 weeks for depression,  And 3 months for HTN

## 2016-04-15 NOTE — Progress Notes (Signed)
Internal Medicine Clinic Attending  Case discussed with Dr. Saraiya at the time of the visit.  We reviewed the resident's history and exam and pertinent patient test results.  I agree with the assessment, diagnosis, and plan of care documented in the resident's note.  

## 2016-05-07 ENCOUNTER — Telehealth: Payer: Self-pay | Admitting: Pharmacist

## 2016-05-15 ENCOUNTER — Other Ambulatory Visit: Payer: Self-pay | Admitting: Pharmacist

## 2016-05-15 MED ORDER — AMLODIPINE BESYLATE 5 MG PO TABS: 5.0000 mg | ORAL_TABLET | Freq: Every day | ORAL | 5 refills | Status: DC

## 2016-05-20 NOTE — Progress Notes (Signed)
Patient called to report he is doing well with his medications, would like to meet with me at upcoming appointment to discuss smoking cessation options

## 2016-05-25 ENCOUNTER — Encounter: Payer: Self-pay | Admitting: Internal Medicine

## 2016-05-25 ENCOUNTER — Ambulatory Visit: Payer: Self-pay | Admitting: Pharmacist

## 2016-06-25 ENCOUNTER — Telehealth: Payer: Self-pay

## 2016-06-25 DIAGNOSIS — W19XXXA Unspecified fall, initial encounter: Secondary | ICD-10-CM

## 2016-06-25 DIAGNOSIS — F322 Major depressive disorder, single episode, severe without psychotic features: Secondary | ICD-10-CM

## 2016-06-25 NOTE — Telephone Encounter (Signed)
Patient was contacted by Paige Cawley, PharmD candidate. I agree with the assessment and plan of care documented.  
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
## 2016-07-06 ENCOUNTER — Telehealth: Payer: Self-pay | Admitting: Pharmacist

## 2016-07-06 DIAGNOSIS — I10 Essential (primary) hypertension: Secondary | ICD-10-CM

## 2016-07-06 MED ORDER — NAPROXEN 500 MG PO TABS: 500.0000 mg | ORAL_TABLET | Freq: Two times a day (BID) | ORAL | 0 refills | Status: AC

## 2016-07-06 MED ORDER — AMLODIPINE BESYLATE 10 MG PO TABS: 10.0000 mg | ORAL_TABLET | Freq: Every day | ORAL | 0 refills | Status: DC

## 2016-07-06 MED ORDER — SERTRALINE HCL 25 MG PO TABS: 25.0000 mg | ORAL_TABLET | Freq: Every day | ORAL | 0 refills | Status: DC

## 2016-07-06 NOTE — Addendum Note (Signed)
Addended by: Mliss Fritz on: 07/06/2016 02:10 PM   Modules accepted: Orders

## 2016-07-08 NOTE — Progress Notes (Signed)
Patient requested phone call, unable to reach

## 2016-09-15 ENCOUNTER — Telehealth: Payer: Self-pay | Admitting: Internal Medicine

## 2016-09-15 NOTE — Telephone Encounter (Signed)
CALLED PATIENT, LMTCB, IT IS TIME TO RENEW GCCN CARD °

## 2016-09-15 NOTE — Telephone Encounter (Signed)
PT CALLED HE HAS BEEN AWARDED HIS DISABILITY, DOES NOT KNOW WHEN CHECK WILL START OR MEDICARE GOES IN TO EFFECT, HE WILL CALL BACK ONCE HE RECEIVES HIS APPROVAL LETTER FROM DISABILITY. IF MEDICARE DOES NOT START HE WILL APPLY FOR GCCN AGAIN

## 2016-10-05 ENCOUNTER — Telehealth: Payer: Self-pay | Admitting: Pharmacist

## 2016-10-09 NOTE — Progress Notes (Signed)
Called patient to follow up on medication help, unable to reach

## 2016-10-30 ENCOUNTER — Telehealth: Payer: Self-pay | Admitting: Internal Medicine

## 2016-10-30 ENCOUNTER — Encounter: Payer: Self-pay | Admitting: Pharmacist

## 2016-10-30 NOTE — Progress Notes (Signed)
Patient was enrolled in [facility] pharmacy.

## 2016-10-30 NOTE — Telephone Encounter (Signed)
I called the pt on 8/17, and left a voicemail as pt has not been seen in the clinic for a while, and also to remind him that his low dose CT scan of the lung is due. I requested him to call back in clinic if there are any questions.

## 2017-03-23 ENCOUNTER — Other Ambulatory Visit: Payer: Self-pay

## 2017-03-23 ENCOUNTER — Encounter (HOSPITAL_COMMUNITY): Payer: Self-pay | Admitting: Emergency Medicine

## 2017-03-23 DIAGNOSIS — I10 Essential (primary) hypertension: Secondary | ICD-10-CM | POA: Insufficient documentation

## 2017-03-23 DIAGNOSIS — Z79899 Other long term (current) drug therapy: Secondary | ICD-10-CM | POA: Insufficient documentation

## 2017-03-23 DIAGNOSIS — F1010 Alcohol abuse, uncomplicated: Secondary | ICD-10-CM | POA: Insufficient documentation

## 2017-03-23 DIAGNOSIS — T50901A Poisoning by unspecified drugs, medicaments and biological substances, accidental (unintentional), initial encounter: Secondary | ICD-10-CM | POA: Insufficient documentation

## 2017-03-23 DIAGNOSIS — F17210 Nicotine dependence, cigarettes, uncomplicated: Secondary | ICD-10-CM | POA: Insufficient documentation

## 2017-03-23 LAB — COMPREHENSIVE METABOLIC PANEL
ALBUMIN: 4.1 g/dL (ref 3.5–5.0)
ALT: 19 U/L (ref 17–63)
AST: 30 U/L (ref 15–41)
Alkaline Phosphatase: 45 U/L (ref 38–126)
Anion gap: 10 (ref 5–15)
BUN: 5 mg/dL — AB (ref 6–20)
CHLORIDE: 100 mmol/L — AB (ref 101–111)
CO2: 32 mmol/L (ref 22–32)
Calcium: 9.1 mg/dL (ref 8.9–10.3)
Creatinine, Ser: 0.95 mg/dL (ref 0.61–1.24)
GFR calc Af Amer: >60 mL/min (ref >60)
GLUCOSE: 108 mg/dL — AB (ref 65–99)
POTASSIUM: 2.8 mmol/L — AB (ref 3.5–5.1)
SODIUM: 142 mmol/L (ref 135–145)
Total Bilirubin: 0.5 mg/dL (ref 0.3–1.2)
Total Protein: 7.5 g/dL (ref 6.5–8.1)

## 2017-03-23 LAB — RAPID URINE DRUG SCREEN, HOSP PERFORMED
Amphetamines: NOT DETECTED
BARBITURATES: NOT DETECTED
Benzodiazepines: NOT DETECTED
Cocaine: POSITIVE — AB
Opiates: NOT DETECTED
Tetrahydrocannabinol: NOT DETECTED

## 2017-03-23 LAB — CBC
HEMATOCRIT: 44.4 % (ref 39.0–52.0)
Hemoglobin: 15.2 g/dL (ref 13.0–17.0)
MCH: 33.6 pg (ref 26.0–34.0)
MCHC: 34.2 g/dL (ref 30.0–36.0)
MCV: 98 fL (ref 78.0–100.0)
Platelets: 317 10*3/uL (ref 150–400)
RBC: 4.53 MIL/uL (ref 4.22–5.81)
RDW: 12.6 % (ref 11.5–15.5)
WBC: 5.2 10*3/uL (ref 4.0–10.5)

## 2017-03-23 NOTE — ED Triage Notes (Signed)
Patient from home, patient has taken 8 Gabapentin and 8 Trazodone.  Patient is alcoholic, drinks a fifth a day, which he has done.  He states that he just wants to sleep, but denies any SI/HI at this time.

## 2017-03-24 ENCOUNTER — Emergency Department (HOSPITAL_COMMUNITY)
Admission: EM | Admit: 2017-03-24 | Discharge: 2017-03-24 | Disposition: A | Discharge status: Home / Self Care | Payer: Self-pay | Attending: Emergency Medicine | Admitting: Emergency Medicine

## 2017-03-24 DIAGNOSIS — F1010 Alcohol abuse, uncomplicated: Secondary | ICD-10-CM

## 2017-03-24 DIAGNOSIS — T50901A Poisoning by unspecified drugs, medicaments and biological substances, accidental (unintentional), initial encounter: Secondary | ICD-10-CM

## 2017-03-24 LAB — SALICYLATE LEVEL: Salicylate Lvl: <7 mg/dL (ref 2.8–30.0)

## 2017-03-24 LAB — ETHANOL: Alcohol, Ethyl (B): 289 mg/dL — ABNORMAL HIGH (ref <10)

## 2017-03-24 LAB — ACETAMINOPHEN LEVEL

## 2017-03-24 MED ORDER — POTASSIUM CHLORIDE CRYS ER 20 MEQ PO TBCR
40.0000 meq | EXTENDED_RELEASE_TABLET | Freq: Two times a day (BID) | ORAL | Status: DC
  Administered 2017-03-24: 40 meq via ORAL
  Filled 2017-03-24: qty 2

## 2017-03-24 NOTE — ED Notes (Signed)
Poison control contacted, pt needs EKG and supplement K level, repeat and then clear
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
## 2017-03-30 ENCOUNTER — Inpatient Hospital Stay (HOSPITAL_COMMUNITY)
Admission: AD | Admit: 2017-03-30 | Payer: Federal, State, Local not specified - Other | Source: Intra-hospital | Admitting: Psychiatry

## 2017-03-30 ENCOUNTER — Encounter (HOSPITAL_COMMUNITY): Payer: Self-pay

## 2017-03-30 ENCOUNTER — Emergency Department (HOSPITAL_COMMUNITY)
Admission: EM | Admit: 2017-03-30 | Discharge: 2017-03-30 | Disposition: A | Discharge status: Home / Self Care | Payer: Self-pay | Attending: Emergency Medicine | Admitting: Emergency Medicine

## 2017-03-30 DIAGNOSIS — F1020 Alcohol dependence, uncomplicated: Secondary | ICD-10-CM | POA: Diagnosis present

## 2017-03-30 DIAGNOSIS — F10920 Alcohol use, unspecified with intoxication, uncomplicated: Secondary | ICD-10-CM

## 2017-03-30 DIAGNOSIS — F1094 Alcohol use, unspecified with alcohol-induced mood disorder: Secondary | ICD-10-CM | POA: Diagnosis present

## 2017-03-30 DIAGNOSIS — I10 Essential (primary) hypertension: Secondary | ICD-10-CM | POA: Insufficient documentation

## 2017-03-30 DIAGNOSIS — F10220 Alcohol dependence with intoxication, uncomplicated: Secondary | ICD-10-CM | POA: Insufficient documentation

## 2017-03-30 DIAGNOSIS — Z79899 Other long term (current) drug therapy: Secondary | ICD-10-CM | POA: Insufficient documentation

## 2017-03-30 DIAGNOSIS — F17210 Nicotine dependence, cigarettes, uncomplicated: Secondary | ICD-10-CM | POA: Insufficient documentation

## 2017-03-30 DIAGNOSIS — F329 Major depressive disorder, single episode, unspecified: Secondary | ICD-10-CM | POA: Insufficient documentation

## 2017-03-30 DIAGNOSIS — Y908 Blood alcohol level of 240 mg/100 ml or more: Secondary | ICD-10-CM | POA: Insufficient documentation

## 2017-03-30 DIAGNOSIS — F1520 Other stimulant dependence, uncomplicated: Secondary | ICD-10-CM | POA: Insufficient documentation

## 2017-03-30 DIAGNOSIS — Z046 Encounter for general psychiatric examination, requested by authority: Secondary | ICD-10-CM | POA: Insufficient documentation

## 2017-03-30 LAB — COMPREHENSIVE METABOLIC PANEL
ALT: 19 U/L (ref 17–63)
AST: 38 U/L (ref 15–41)
Albumin: 4.1 g/dL (ref 3.5–5.0)
Alkaline Phosphatase: 53 U/L (ref 38–126)
Anion gap: 9 (ref 5–15)
BUN: 9 mg/dL (ref 6–20)
CHLORIDE: 106 mmol/L (ref 101–111)
CO2: 26 mmol/L (ref 22–32)
CREATININE: 1.08 mg/dL (ref 0.61–1.24)
Calcium: 9 mg/dL (ref 8.9–10.3)
GFR calc non Af Amer: >60 mL/min (ref >60)
Glucose, Bld: 107 mg/dL — ABNORMAL HIGH (ref 65–99)
POTASSIUM: 4.1 mmol/L (ref 3.5–5.1)
SODIUM: 141 mmol/L (ref 135–145)
Total Bilirubin: 0.5 mg/dL (ref 0.3–1.2)
Total Protein: 7.2 g/dL (ref 6.5–8.1)

## 2017-03-30 LAB — CBC
HEMATOCRIT: 44.7 % (ref 39.0–52.0)
HEMOGLOBIN: 15.9 g/dL (ref 13.0–17.0)
MCH: 34.4 pg — ABNORMAL HIGH (ref 26.0–34.0)
MCHC: 35.6 g/dL (ref 30.0–36.0)
MCV: 96.8 fL (ref 78.0–100.0)
Platelets: 314 10*3/uL (ref 150–400)
RBC: 4.62 MIL/uL (ref 4.22–5.81)
RDW: 12.5 % (ref 11.5–15.5)
WBC: 7.5 10*3/uL (ref 4.0–10.5)

## 2017-03-30 LAB — RAPID URINE DRUG SCREEN, HOSP PERFORMED
AMPHETAMINES: NOT DETECTED
BARBITURATES: NOT DETECTED
BENZODIAZEPINES: POSITIVE — AB
COCAINE: POSITIVE — AB
Opiates: NOT DETECTED
TETRAHYDROCANNABINOL: NOT DETECTED

## 2017-03-30 LAB — ETHANOL: ALCOHOL ETHYL (B): 259 mg/dL — AB (ref <10)

## 2017-03-30 MED ORDER — LORAZEPAM 1 MG PO TABS: 1.0000 mg | ORAL_TABLET | Freq: Every day | ORAL | Status: DC

## 2017-03-30 MED ORDER — HYDROXYZINE HCL 25 MG PO TABS
25.0000 mg | ORAL_TABLET | Freq: Four times a day (QID) | ORAL | Status: DC | PRN
Start: 2017-03-30 — End: 2017-03-30
  Administered 2017-03-30: 25 mg via ORAL
  Filled 2017-03-30: qty 1

## 2017-03-30 MED ORDER — LORAZEPAM 1 MG PO TABS: 1.0000 mg | ORAL_TABLET | Freq: Three times a day (TID) | ORAL | Status: DC

## 2017-03-30 MED ORDER — ONDANSETRON 4 MG PO TBDP: 4.0000 mg | ORAL_TABLET | Freq: Four times a day (QID) | ORAL | Status: DC | PRN

## 2017-03-30 MED ORDER — LOPERAMIDE HCL 2 MG PO CAPS: 2.0000 mg | ORAL_CAPSULE | ORAL | Status: DC | PRN

## 2017-03-30 MED ORDER — LORAZEPAM 1 MG PO TABS
1.0000 mg | ORAL_TABLET | Freq: Four times a day (QID) | ORAL | Status: DC
  Administered 2017-03-30: 1 mg via ORAL
  Filled 2017-03-30: qty 1

## 2017-03-30 MED ORDER — LORAZEPAM 1 MG PO TABS: 1.0000 mg | ORAL_TABLET | Freq: Four times a day (QID) | ORAL | Status: DC | PRN

## 2017-03-30 MED ORDER — ADULT MULTIVITAMIN W/MINERALS CH
1.0000 | ORAL_TABLET | Freq: Every day | ORAL | Status: DC
  Administered 2017-03-30: 1 via ORAL
  Filled 2017-03-30: qty 1

## 2017-03-30 MED ORDER — VITAMIN B-1 100 MG PO TABS: 100.0000 mg | ORAL_TABLET | Freq: Every day | ORAL | Status: DC

## 2017-03-30 MED ORDER — LORAZEPAM 1 MG PO TABS: 1.0000 mg | ORAL_TABLET | Freq: Two times a day (BID) | ORAL | Status: DC

## 2017-03-30 MED ORDER — THIAMINE HCL 100 MG/ML IJ SOLN
100.0000 mg | Freq: Once | INTRAMUSCULAR | Status: AC
  Administered 2017-03-30: 100 mg via INTRAMUSCULAR
  Filled 2017-03-30: qty 2

## 2017-03-30 NOTE — BHH Suicide Risk Assessment (Signed)
Suicide Risk Assessment  Discharge Assessment   [facility]   Principal Problem: Alcohol-induced mood disorder [facility](HCC) Discharge Diagnoses:  Patient Active Problem List   Diagnosis Date Noted  . Falls [phone][W19.XXXA] 11/04/2015  . Vision blurred [H53.8] 08/26/2015  . Alcohol-induced mood disorder (HCC) [F10.94] 07/22/2015  . Hyperlipidemia [E78.5] 07/22/2015  . Severe depression (HCC) [F32.2] 07/22/2015  . HTN (hypertension) [I10] 05/23/2015  . Tobacco abuse [Z72.0] 05/23/2015  . Healthcare maintenance [Z00.00] 05/23/2015  . Insomnia [G47.00] 05/23/2015   Pt was seen and chart reviewed with treatment team.  Pt denies suicidal/homicidal ideation, denies auditory/visual hallucinations and does not appear to be responding to internal stimuli. Pt stated he does not have a problem and does not need an outpatient resources. BAL 259, UDS positive cocaine and benzos. Pt is psychiatrically clear for discharge.   Total Time spent with patient: 30 minutes  Musculoskeletal: Strength & Muscle Tone: within normal limits Gait & Station: normal Patient leans: N/A  Psychiatric Specialty Exam:   Blood pressure (!) 157/97, pulse 96, temperature 98.6 F (37 C), temperature source Oral, resp. rate 18, SpO2 97 %.There is no height or weight on file to calculate BMI.  General Appearance: Casual  Eye Contact::  Good  Speech:  Clear and Coherent and Normal Rate409  Volume:  Normal  Mood:  Euthymic  Affect:  Congruent  Thought Process:  Coherent, Goal Directed and Linear  Orientation:  Full (Time, Place, and Person)  Thought Content:  Logical  Suicidal Thoughts:  No  Homicidal Thoughts:  No  Memory:  Remote;   Good  Judgement:  Fair  Insight:  Fair  Psychomotor Activity:  Normal  Concentration:  Good  Recall:  Good  Fund of Knowledge:Good  Language: Good  Akathisia:  No  Handed:  Right  AIMS (if indicated):     Assets:  Medical laboratory scientific officerCommunication Skills Financial  Resources/Insurance Housing Social Support  Sleep:     Cognition: WNL  ADL's:  Intact   Mental Status Per Nursing Assessment::   On Admission:   Intoxicated and "high on drugs"  Demographic Factors:  Male, Caucasian, Low socioeconomic status and Unemployed  Loss Factors: Financial problems/change in socioeconomic status  Historical Factors: Impulsivity  Risk Reduction Factors:   Sense of responsibility to family and Living with another person, especially a relative  Continued Clinical Symptoms:  Depression:   Impulsivity Alcohol/Substance Abuse/Dependencies  Cognitive Features That Contribute To Risk:  Closed-mindedness    Suicide Risk:  Minimal: No identifiable suicidal ideation.  Patients presenting with no risk factors but with morbid ruminations; may be classified as minimal risk based on the severity of the depressive symptoms    Plan Of Care/Follow-up recommendations:  Activity:  as tolerated Diet:  Heart Healthy  Warren AbbeLaurie Britton Monai Hindes, NP 03/30/2017, 12:01 PM

## 2017-03-30 NOTE — BH Assessment (Signed)
[facility] Assessment Progress Note  Per Nelly RoutArchana Kumar, MD, this pt does not require psychiatric hospitalization at this time.  Pt presents under IVC initiated by pt's live-in girlfriend, which Dr Lucianne MussKumar has rescinded.  [facility] about area substance abuse treatment programs.  This has been included in pt's discharge instructions.  Pt would also benefit from seeing Peer Support Specialists; they will be asked to speak to pt.  Pt's nurse, Morrie Sheldonshley, has been notified.  Doylene Canninghomas Janie Strothman, MA Triage Specialist [phone]
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
## 2017-03-30 NOTE — Discharge Instructions (Signed)
To help you maintain a sober lifestyle, a substance abuse treatment program may be beneficial to you.  Contact one of the following facilities at your earliest opportunity to ask about enrolling: ° °RESIDENTIAL PROGRAMS: ° °     [facility] °     [address] °     [place], [place] [address] °     [phone] ° °     [facility] °     [address] °     [place], [place] [address] °     [phone] ° °     [facility] °     [address] °     [place], [place] [address] °     [phone] ° °OUTPATIENT PROGRAMS: ° °     [facility] °     [address] °     [place], [place] [address] °     [phone] °     New patients are seen at the walk-in clinic every Tuesday from 9:00 am - 12:00 pm °

## 2017-03-30 NOTE — BH Assessment (Signed)
Assessment Note  Warren Patrick is an 62 y.o. male who was brought to [facility] by [employer] after being involuntarily petitioned by Aaron Mose for increasingly aggressive behaviors.  According to ER Provider's documentation, Patient here for increasingly aggressive behavior, alcohol abuse, abuse and overdose (last week) on his regular medications, concern for being a danger to himself. IVC initiated by his girlfriend. The patient is significantly intoxicated but happy, loud, non-aggressive.  Pt denies SI/HI/ and A/V-hallucinations.  Pt reports drinking 3-4 mixed drinks nightly (burbon and 7up or other citrus drink).  Pt denies using any other substances.  Pt contracts for safety but is extremely loud and intrusive of personal boundaries of LPCA.  Pt reports that he lives with his girlfriend and can return at discharge.  Pt reports he is currently unemployed since June 2015 and was recently approved for disability due to a rotary cup injury that occurred on a job.  Patient was wearing sweatsuit and appeared disheveled as well as inappropriately groomed.  Pt was alert and hyperverbal throughout the assessment.  Patient made fair eye contact and had abnormal psychomotor activity with little regard to personal boundaries.  Patient spoke in a loud voice without pressured speech.  Pt expressed feeling upset.  Pt's affect appeared euphoric or elevated to hyperactive and incongruent with stated mood. Pt's thought process was coherent with a flight of ideas.  Pt presented with impaired insight and judgement.  Pt did not appear to be responding to internal stimuli.  Disposition: Discussed case with [facility] provider, Donell Sievert, PA who recommends inpatient treatment. LPCA spoke to [facility], Fransico Michael, RN to see if there is any available beds at [facility].  Pt is accepted to [facility] and can come after 0900.  LPCA informed ER provider, Edmonia Lynch, PA and pt's nurse of the PA's recommended disposition.  Diagnosis: Major Depression  Disorder; Alcohol Use Disorder, Moderate; Stimulant Use Disorder, Moderate  Past Medical History:  Past Medical History:  Diagnosis Date  . Drug-seeking behavior   . ETOH abuse   . Hematuria, microscopic   . HTN (hypertension)   . PTSD (post-traumatic stress disorder)     Past Surgical History:  Procedure Laterality Date  . ROTATOR CUFF REPAIR     right shoulder, on 10/2013    Family History:  Family History  Problem Relation Age of Onset  . Cancer Mother        breast cancer  . Cancer Father        lung cancer  . Hypertension Father   . Cancer Brother        pancreatic cancer  . Diabetes Brother   . Diabetes Sister     Social History:  reports that he has been smoking cigarettes.  He has been smoking about 0.50 packs per day. he has never used smokeless tobacco. He reports that he drinks about 16.8 oz of alcohol per week. He reports that he does not use drugs.  Additional Social History:  Alcohol / Drug Use Pain Medications: See MARs Prescriptions: See MARs Over the Counter: See MARs History of alcohol / drug use?: Yes Longest period of sobriety (when/how long): unknown Negative Consequences of Use: Personal relationships Substance #1 Name of Substance 1: Alcohol 1 - Age of First Use: unknown 1 - Amount (size/oz): 3-4 drinks 1 - Frequency: daily 1 - Duration: ongoing 1 - Last Use / Amount: PTA  CIWA: CIWA-Ar BP: (!) 157/104 Pulse Rate: 90 COWS:    Allergies:  Allergies  Allergen Reactions  . Bee Venom Swelling    Home Medications:  (Not in a hospital admission)  OB/GYN Status:  No LMP for male patient.  General Assessment Data [facility]: [facility] TTS Assessment: In system Is this a Tele or Face-to-Face Assessment?: Face-to-Face Is this an Initial Assessment or a Re-assessment for this encounter?: Initial Assessment Marital status: Long term relationship Is patient pregnant?: No Pregnancy Status: No Living Arrangements:  Spouse/significant other Can pt return to current living arrangement?: Yes Admission Status: Involuntary Is patient capable of signing voluntary admission?: Yes Referral Source: Other Insurance type: None listed(Pt reports having Medicare due to disability not verified)     Crisis Care Plan Living Arrangements: Spouse/significant other Legal Guardian: Other:(Self)  Education Status Is patient currently in school?: No Highest grade of school patient has completed: 12th grade  Risk to self with the past 6 months Suicidal Ideation: No(Pt denies) Has patient been a risk to self within the past 6 months prior to admission? : No(Pt denies) Suicidal Intent: No(pt denies) Has patient had any suicidal intent within the past 6 months prior to admission? : No Is patient at risk for suicide?: No Suicidal Plan?: No Has patient had any suicidal plan within the past 6 months prior to admission? : No Access to Means: No What has been your use of drugs/alcohol within the last 12 months?: Alcohol Previous Attempts/Gestures: No Triggers for Past Attempts: None known Intentional Self Injurious Behavior: None Family Suicide History: Unknown Recent stressful life event(s): Other (Comment)(pt is unable to fall asleep) Persecutory voices/beliefs?: No Depression: No Depression Symptoms: Insomnia Substance abuse history and/or treatment for substance abuse?: Yes Suicide prevention information given to non-admitted patients: Not applicable  Risk to Others within the past 6 months Homicidal Ideation: No(Pt denies) Does patient have any lifetime risk of violence toward others beyond the six months prior to admission? : No(pt denies) Thoughts of Harm to Others: No(pt denies) Current Homicidal Intent: No Current Homicidal Plan: No Access to Homicidal Means: No History of harm to others?: No Assessment of Violence: None Noted Does patient have access to weapons?: No Criminal Charges Pending?:  No Does patient have a court date: No Is patient on probation?: No  Psychosis Hallucinations: None noted Delusions: None noted  Mental Status Report Appearance/Hygiene: Body odor, Disheveled, Poor hygiene Eye Contact: Fair Motor Activity: Hyperactivity, Restlessness Speech: Logical/coherent, Rapid, Loud Level of Consciousness: Alert, Restless Mood: Silly, Pleasant Affect: Appropriate to circumstance, Silly Anxiety Level: Minimal Thought Processes: Coherent, Flight of Ideas Judgement: Impaired Orientation: Person, Place, Situation, Appropriate for developmental age Obsessive Compulsive Thoughts/Behaviors: None  Cognitive Functioning Concentration: Fair(pt was hyper) Memory: Recent Intact, Remote Intact IQ: Average Insight: Fair Impulse Control: Fair Appetite: Fair Sleep: Decreased Total Hours of Sleep: 0(pt reports getting 4 hours of sleep per week) Vegetative Symptoms: None  ADLScreening [facility] Assessment Services) Patient's cognitive ability adequate to safely complete daily activities?: Yes Patient able to express need for assistance with ADLs?: Yes Independently performs ADLs?: Yes (appropriate for developmental age)  Prior Inpatient Therapy Prior Inpatient Therapy: No  Prior Outpatient Therapy Prior Outpatient Therapy: No Does patient have an ACCT team?: No Does patient have Intensive In-House Services?  : No Does patient have [facility] services? : No Does patient have P4CC services?: No  ADL Screening (condition at time of admission) Patient's cognitive ability adequate to safely complete daily activities?: Yes Is the patient deaf or have difficulty hearing?: No Does the patient have difficulty seeing, even when wearing glasses/contacts?:  No Does the patient have difficulty concentrating, remembering, or making decisions?: No Patient able to express need for assistance with ADLs?: Yes Does the patient have difficulty dressing or bathing?: No Independently  performs ADLs?: Yes (appropriate for developmental age) Does the patient have difficulty walking or climbing stairs?: No Weakness of Legs: None Weakness of Arms/Hands: None  Home Assistive Devices/Equipment Home Assistive Devices/Equipment: None    Abuse/Neglect Assessment (Assessment to be complete while patient is alone) Abuse/Neglect Assessment Can Be Completed: Yes Physical Abuse: Denies Verbal Abuse: Denies Sexual Abuse: Denies Exploitation of patient/patient's resources: Denies Self-Neglect: Denies Values / Beliefs Cultural Requests During Hospitalization: None Spiritual Requests During Hospitalization: None   Advance Directives (For Healthcare) Does Patient Have a Medical Advance Directive?: No Would patient like information on creating a medical advance directive?: No - Patient declined    Additional Information 1:1 In Past 12 Months?: No CIRT Risk: No Elopement Risk: No Does patient have medical clearance?: Yes     Disposition: [facility], Donell SievertSpencer Simon, PA who recommends inpatient treatment. [facility] Fransico MichaelKim Brooks, RN to see if [facility]  [facility] and can [facility] after [facility].  LPCA informed ER provider, Edmonia LynchSherry Upstill, PA and pt's nurse of the PA's recommended disposition.  Disposition Initial Assessment Completed for this Encounter: Yes Disposition of Patient: Inpatient treatment program(Per Donell SievertSpencer Simon, PA) Type of inpatient treatment program: Adult([facility])  On Site Evaluation by:  Avaiah Stempel L. Ares Cardozo, MS, LPCA, NCC Reviewed with Physician:  Donell SievertSpencer Simon, PA  Isay Perleberg L Kinze Labo, MS, LPCA, NCC 03/30/2017 4:39 AM

## 2017-03-30 NOTE — ED Provider Notes (Signed)
[facility]-EMERGENCY DEPT Provider Note   CSN: [id number] Arrival date & time: 03/30/17  0132     History   Chief Complaint Chief Complaint  Patient presents with  . IVC    HPI Warren Patrick is a 62 y.o. male.  Patient here via [employer] under Involuntary petition for increasingly aggressive behavior, alcohol abuse, abuse and overdose (last week) on his regular medications, concern for being a danger to himself. IVC initiated by his girlfriend. The patient is significantly intoxicated but happy, loud, non-aggressive. He is unable to contribute reliably to history. He does deny that overdose on gabapentin and trazodone last week was suicidal in nature, stating he just wanted to sleep.    The history is provided by the patient and the police. No language interpreter was used.    Past Medical History:  Diagnosis Date  . Drug-seeking behavior   . ETOH abuse   . Hematuria, microscopic   . HTN (hypertension)   . PTSD (post-traumatic stress disorder)     Patient Active Problem List   Diagnosis Date Noted  . Falls 11/04/2015  . Vision blurred 08/26/2015  . Alcohol use disorder 07/22/2015  . Hyperlipidemia 07/22/2015  . Severe depression (HCC) 07/22/2015  . HTN (hypertension) 05/23/2015  . Tobacco abuse 05/23/2015  . Healthcare maintenance 05/23/2015  . Insomnia 05/23/2015    Past Surgical History:  Procedure Laterality Date  . ROTATOR CUFF REPAIR     right shoulder, on 10/2013       Home Medications    Prior to Admission medications   Medication Sig Start Date End Date Taking? Authorizing Provider  amLODipine (NORVASC) 10 MG tablet Take 1 tablet (10 mg total) by mouth daily. Dose change, d/c previous amlodipine prescription 07/06/16 07/06/17  Deneise LeverSaraiya, Parth, MD  atorvastatin (LIPITOR) 40 MG tablet Take 1 tablet (40 mg total) by mouth daily. 09/20/15   Deneise LeverSaraiya, Parth, MD  naproxen (NAPROSYN) 500 MG tablet Take 1 tablet (500 mg total) by mouth 2 (two)  times daily with a meal. As needed for pain 07/06/16 07/06/17  Deneise LeverSaraiya, Parth, MD  nicotine (NICOTROL) 10 MG inhaler Inhale 1 Cartridge (1 continuous puffing total) into the lungs as needed for smoking cessation. 01/09/16   Deneise LeverSaraiya, Parth, MD  sertraline (ZOLOFT) 25 MG tablet Take 1 tablet (25 mg total) by mouth daily. 07/06/16   Deneise LeverSaraiya, Parth, MD    Family History Family History  Problem Relation Age of Onset  . Cancer Mother        breast cancer  . Cancer Father        lung cancer  . Hypertension Father   . Cancer Brother        pancreatic cancer  . Diabetes Brother   . Diabetes Sister     Social History Social History   Tobacco Use  . Smoking status: Current Every Day Smoker    Packs/day: 0.50    Types: Cigarettes  . Smokeless tobacco: Never Used  . Tobacco comment: 1/2PPD  Substance Use Topics  . Alcohol use: Yes    Alcohol/week: 16.8 oz    Types: 28 Standard drinks or equivalent per week    Comment: Mixed drinks daily.  . Drug use: No     Allergies   Bee venom   Review of Systems Review of Systems  Reason unable to perform ROS: Intoxication.     Physical Exam Updated Vital Signs BP (!) 157/104   Pulse 90   Temp 97.6 F (36.4 C) (  Oral)   Resp 20   SpO2 97%   Physical Exam  Constitutional: He appears well-developed and well-nourished.  HENT:  Head: Normocephalic.  Neck: Normal range of motion. Neck supple.  Cardiovascular: Normal rate and regular rhythm.  Pulmonary/Chest: Effort normal and breath sounds normal.  Abdominal: Soft. Bowel sounds are normal. There is no tenderness. There is no rebound and no guarding.  Musculoskeletal: Normal range of motion.  Neurological: He is alert. No cranial nerve deficit.  Skin: Skin is warm and dry. No rash noted.  Psychiatric: His affect is labile. His speech is rapid and/or pressured. He is hyperactive. He is not actively hallucinating.     ED Treatments / Results  Labs (all labs ordered are listed, but  only abnormal results are displayed) Labs Reviewed  CBC - Abnormal; Notable for the following components:      Result Value   MCH 34.4 (*)    All other components within normal limits  COMPREHENSIVE METABOLIC PANEL  ETHANOL  RAPID URINE DRUG SCREEN, HOSP PERFORMED    EKG  EKG Interpretation None       Radiology No results found.  Procedures Procedures (including critical care time)  Medications Ordered in ED Medications - No data to display   Initial Impression / Assessment and Plan / ED Course  I have reviewed the triage vital signs and the nursing notes.  Pertinent labs & imaging results that were available during my care of the patient were reviewed by me and considered in my medical decision making (see chart for details).     Patient is here under IVC for possible danger to himself. TTS consultation will be required to determine disposition.  Final Clinical Impressions(s) / ED Diagnoses   Final diagnoses:  None   1. Alcohol intoxication 2. Aggressive Behavior (per IVC)  ED Discharge Orders    None       Elpidio Anis, PA-C 03/30/17 7829    Shon Baton, MD 03/30/17 [phone]

## 2017-03-30 NOTE — ED Notes (Signed)
Bed: [facility] Expected date:  Expected time:  Means of arrival:  Comments: 

## 2017-03-30 NOTE — Patient Outreach (Signed)
ED Peer Support Specialist Patient Intake (Complete at intake & 30-60 Day Follow-up)  Name: Warren Patrick  MRN: [id number]  Age: 62 y.o.   Date of Admission: 03/30/2017  Intake: Initial Comments:      Primary Reason Admitted: Patient here for increasingly aggressive behavior, alcohol abuse, abuse and overdose (last week) on his regular medications, concern for being a danger to himself. IVC initiated by his girlfriend. The patient is significantly intoxicated but happy, loud, non-aggressive.  Pt denies SI/HI/ and A/V-hallucinations.  Pt reports drinking 3-4 mixed drinks nightly (burbon and 7up or other citrus drink).  Pt denies using any other substances.  Pt contracts for safety but is extremely loud and intrusive of personal boundaries of LPCA    Lab values: Alcohol/ETOH: Positive Positive UDS? Yes Amphetamines: No Barbiturates: No Benzodiazepines: Yes Cocaine: Yes Opiates: No Cannabinoids:    Demographic information: Gender: Male Ethnicity: American [place] Marital Status: Divorced Insurance Status: Medicare(A an B) Ecologist (Work Neurosurgeon, Physicist, medical, Retail banker) Lives with: Partner/Spouse Living situation: House/Apartment  Reported Patient History: Patient reported health conditions: Hypertension Patient aware of HIV and hepatitis status: No  In past year, has patient visited ED for any reason? No  Number of ED visits:    Reason(s) for visit:    In past year, has patient been hospitalized for any reason? No  Number of hospitalizations:    Reason(s) for hospitalization:    In past year, has patient been arrested? No  Number of arrests:    Reason(s) for arrest:    In past year, has patient been incarcerated? No  Number of incarcerations:    Reason(s) for incarceration:    In past year, has patient received medication-assisted treatment? Yes, [facility]io[facility] Treatment Programs ([facility])  In past year, patient received the  following treatments:    In past year, has patient received any harm reduction services? No  Did this include any of the following?    In past year, has patient received care from a mental health provider for diagnosis other than SUD? No  In past year, is this first time patient has overdosed? Yes(sleeping pills )  Number of past overdoses:    In past year, is this first time patient has been hospitalized for an overdose? No  Number of hospitalizations for overdose(s):    Is patient currently receiving treatment for a mental health diagnosis? No  Patient reports experiencing difficulty participating in SUD treatment: No    Most important reason(s) for this difficulty?    Has patient received prior services for treatment? No  In past, patient has received services from following agencies:    Plan of Care:  Suggested follow up at these agencies/treatment centers: [facility] ([facility] [place]), [facility])(States that he just want to go home to his dogs)  Other information: CPSS met with Pt for motivational interviewing and to monitor services. CPSS processed with Pt about the importance of him looking into getting some help for himself. CPSS was made aware that he feels that he does not have any issues at the time, and he just wants to get home to his dogs. CPSS left contact information so that he can contact CPSS if he changes his mind for going to receive treatment.    Aaron Edelman Shamicka Inga, CPSS  03/30/2017 11:18 AM

## 2017-03-30 NOTE — ED Notes (Signed)
Pt d/c home per MD order. Discharge summary reviewed with pt. Pt verbalizes understanding. Pt denies SI/HI/AVH. Personal property returned to pt. Pt signed e-signature. Ambulatory off unit with MHT.  

## 2017-03-30 NOTE — ED Notes (Signed)
Patient denies SI/HI/AVH at this time. Plan of care discussed. Encouragement and support provided and safety maintain. Q 15 min safety checks in place and video monitoring. 

## 2017-03-30 NOTE — ED Notes (Signed)
[facility] reports will call back.

## 2017-04-15 ENCOUNTER — Encounter: Payer: Self-pay | Admitting: *Deleted

## 2017-09-21 ENCOUNTER — Encounter: Payer: Self-pay | Admitting: Internal Medicine

## 2017-09-21 ENCOUNTER — Ambulatory Visit: Payer: Self-pay

## 2017-12-29 NOTE — Assessment & Plan Note (Deleted)
BP today ---/----. This is ---- from previous of 143/89.   -------- Previously on HCTZ-Triamterene  - Amlodipine 10mg Daily 

## 2017-12-30 ENCOUNTER — Encounter: Payer: Self-pay | Admitting: Internal Medicine

## 2018-01-20 ENCOUNTER — Encounter: Payer: Self-pay | Admitting: Internal Medicine

## 2018-01-20 NOTE — Assessment & Plan Note (Deleted)
BP today ---/----. This is ---- from previous of 143/89.   -------- Previously on HCTZ-Triamterene  - Amlodipine 10mg  Daily

## 2018-01-20 NOTE — Progress Notes (Deleted)
   CC: ***  HPI:  [name] is a 62 y.o. M with PMHx listed below presenting for ***. Please see the A&P for the status of the patient's chronic medical problems.  Past Medical History:  Diagnosis Date  . Drug-seeking behavior   . ETOH abuse   . Hematuria, microscopic   . HTN (hypertension)   . PTSD (post-traumatic stress disorder)    Review of Systems: Performed and all others negative.  Physical Exam:  There were no vitals filed for this visit. Physical Exam   Assessment & Plan:   See Encounters Tab for problem based charting.  Patient {GC/GE:[id number]::"discussed with","seen with"} Dr. {NAMES:[id number]::"Butcher","Granfortuna","E. Hoffman","Klima","Mullen","Narendra","Raines","Vincent"}

## 2018-05-04 DIAGNOSIS — R609 Edema, unspecified: Secondary | ICD-10-CM | POA: Diagnosis not present

## 2018-05-05 ENCOUNTER — Emergency Department (HOSPITAL_COMMUNITY)
Admission: EM | Admit: 2018-05-05 | Discharge: 2018-05-05 | Disposition: A | Discharge status: Home / Self Care | Payer: Medicare Other | Attending: Emergency Medicine | Admitting: Emergency Medicine

## 2018-05-05 ENCOUNTER — Emergency Department (HOSPITAL_COMMUNITY): Payer: Medicare Other

## 2018-05-05 ENCOUNTER — Other Ambulatory Visit: Payer: Self-pay

## 2018-05-05 ENCOUNTER — Encounter (HOSPITAL_COMMUNITY): Payer: Self-pay | Admitting: Emergency Medicine

## 2018-05-05 DIAGNOSIS — Y92008 Other place in unspecified non-institutional (private) residence as the place of occurrence of the external cause: Secondary | ICD-10-CM | POA: Diagnosis not present

## 2018-05-05 DIAGNOSIS — R52 Pain, unspecified: Secondary | ICD-10-CM | POA: Diagnosis not present

## 2018-05-05 DIAGNOSIS — R0781 Pleurodynia: Secondary | ICD-10-CM | POA: Diagnosis not present

## 2018-05-05 DIAGNOSIS — Z79899 Other long term (current) drug therapy: Secondary | ICD-10-CM | POA: Insufficient documentation

## 2018-05-05 DIAGNOSIS — W000XXA Fall on same level due to ice and snow, initial encounter: Secondary | ICD-10-CM | POA: Diagnosis not present

## 2018-05-05 DIAGNOSIS — F17210 Nicotine dependence, cigarettes, uncomplicated: Secondary | ICD-10-CM | POA: Diagnosis not present

## 2018-05-05 DIAGNOSIS — Y93H1 Activity, digging, shoveling and raking: Secondary | ICD-10-CM | POA: Diagnosis not present

## 2018-05-05 DIAGNOSIS — W19XXXA Unspecified fall, initial encounter: Secondary | ICD-10-CM

## 2018-05-05 DIAGNOSIS — S2242XA Multiple fractures of ribs, left side, initial encounter for closed fracture: Secondary | ICD-10-CM | POA: Diagnosis not present

## 2018-05-05 DIAGNOSIS — S299XXA Unspecified injury of thorax, initial encounter: Secondary | ICD-10-CM | POA: Diagnosis not present

## 2018-05-05 DIAGNOSIS — I10 Essential (primary) hypertension: Secondary | ICD-10-CM | POA: Diagnosis not present

## 2018-05-05 DIAGNOSIS — Y999 Unspecified external cause status: Secondary | ICD-10-CM | POA: Insufficient documentation

## 2018-05-05 MED ORDER — HYDROCODONE-ACETAMINOPHEN 5-325 MG PO TABS
1.0000 | ORAL_TABLET | Freq: Once | ORAL | Status: AC
  Administered 2018-05-05: 1 via ORAL
  Filled 2018-05-05: qty 1

## 2018-05-05 MED ORDER — DICLOFENAC EPOLAMINE 1.3 % TD PTCH
1.0000 | MEDICATED_PATCH | Freq: Two times a day (BID) | TRANSDERMAL | Status: DC
  Administered 2018-05-05: 1 via TRANSDERMAL
  Filled 2018-05-05 (×2): qty 1

## 2018-05-05 MED ORDER — HYDROCODONE-ACETAMINOPHEN 5-325 MG PO TABS: 1.0000 | ORAL_TABLET | Freq: Four times a day (QID) | ORAL | 0 refills | Status: DC | PRN

## 2018-05-05 NOTE — ED Triage Notes (Signed)
Pt states he fell on the front porch and c/o left rib pain. Pt states he slipped on ramp at home. Pt states he has fallen x 3 in snow today. Pt has been drinking alcohol today.

## 2018-05-05 NOTE — ED Provider Notes (Signed)
[facility] EMERGENCY DEPARTMENT Provider Note   CSN: [id number] Arrival date & time: 05/05/18  1818    History   Chief Complaint Chief Complaint  Patient presents with  . Fall    HPI Warren Patrick is a 63 y.o. male.     HPI Presents with left rib pain following a fall. Patient actually had a series of 3 falls today. It is snowing. He notes that he was going outside in an attempt to clear walkway for his girlfriend. He slipped, injuring the second of his 3 falls he struck his left axilla against a railing. Since that time he has had pain focally in the left mid axilla, sharp, worse with activity, inspiration. No other substantial injuries, no syncope, no other weakness or pain. No medication taken for pain relief. The pain is been persistent since the fall. Patient states that he is generally well, though he acknowledges smoking cigarettes, history of COPD.  Past Medical History:  Diagnosis Date  . Drug-seeking behavior   . ETOH abuse   . Hematuria, microscopic   . HTN (hypertension)   . PTSD (post-traumatic stress disorder)     Patient Active Problem List   Diagnosis Date Noted  . Falls 11/04/2015  . Vision blurred 08/26/2015  . Alcohol-induced mood disorder (HCC) 07/22/2015  . Hyperlipidemia 07/22/2015  . Severe depression (HCC) 07/22/2015  . HTN (hypertension) 05/23/2015  . Tobacco abuse 05/23/2015  . Healthcare maintenance 05/23/2015  . Insomnia 05/23/2015    Past Surgical History:  Procedure Laterality Date  . APPENDECTOMY    . ROTATOR CUFF REPAIR     right shoulder, on 10/2013  . TONSILLECTOMY          Home Medications    Prior to Admission medications   Medication Sig Start Date End Date Taking? Authorizing Provider  amLODipine (NORVASC) 10 MG tablet Take 1 tablet (10 mg total) by mouth daily. Dose change, d/c previous amlodipine prescription Patient taking differently: Take 10 mg by mouth daily.  07/06/16 07/06/17  Deneise Lever, MD    atorvastatin (LIPITOR) 40 MG tablet Take 1 tablet (40 mg total) by mouth daily. 09/20/15   Deneise Lever, MD  ibuprofen (ADVIL,MOTRIN) 200 MG tablet Take 200 mg by mouth every 6 (six) hours as needed for moderate pain.    [provider]  nicotine (NICOTROL) 10 MG inhaler Inhale 1 Cartridge (1 continuous puffing total) into the lungs as needed for smoking cessation. Patient not taking: Reported on 03/30/2017 01/09/16   Deneise Lever, MD  sertraline (ZOLOFT) 25 MG tablet Take 1 tablet (25 mg total) by mouth daily. 07/06/16   Deneise Lever, MD    Family History Family History  Problem Relation Age of Onset  . Cancer Mother        breast cancer  . Cancer Father        lung cancer  . Hypertension Father   . Cancer Brother        pancreatic cancer  . Diabetes Brother   . Diabetes Sister     Social History Social History   Tobacco Use  . Smoking status: Current Every Day Smoker    Packs/day: 0.50    Types: Cigarettes  . Smokeless tobacco: Never Used  . Tobacco comment: 1/2PPD  Substance Use Topics  . Alcohol use: Yes    Alcohol/week: 28.0 standard drinks    Types: 28 Standard drinks or equivalent per week    Comment: Mixed drinks daily.  . Drug use: No  Allergies   Bee venom   Review of Systems Review of Systems  Constitutional:       Per HPI, otherwise negative  HENT:       Per HPI, otherwise negative  Respiratory:       Per HPI, otherwise negative  Cardiovascular:       Per HPI, otherwise negative  Gastrointestinal: Negative for vomiting.  Endocrine:       Negative aside from HPI  Genitourinary:       Neg aside from HPI   Musculoskeletal:       Per HPI, otherwise negative  Skin: Negative.   Neurological: Negative for syncope.     Physical Exam Updated Vital Signs BP (!) 156/107 (BP Location: Left Arm)   Pulse 82   Temp 97.6 F (36.4 C) (Oral)   Resp 16   SpO2 100%   Physical Exam Vitals signs and nursing note reviewed.   Constitutional:      General: He is not in acute distress.    Appearance: He is well-developed.  HENT:     Head: Normocephalic and atraumatic.  Eyes:     Conjunctiva/sclera: Conjunctivae normal.  Cardiovascular:     Rate and Rhythm: Normal rate and regular rhythm.  Pulmonary:     Effort: No respiratory distress.     Breath sounds: No stridor. Decreased breath sounds present. No wheezing.  Chest:    Abdominal:     General: There is no distension.  Skin:    General: Skin is warm and dry.  Neurological:     Mental Status: He is alert and oriented to person, place, and time.      ED Treatments / Results  Labs (all labs ordered are listed, but only abnormal results are displayed) Labs Reviewed - No data to display  EKG None  Radiology Dg Ribs Unilateral W/chest Left  Result Date: 05/05/2018 CLINICAL DATA:  Fall with rib pain EXAM: LEFT RIBS AND CHEST - 3+ VIEW COMPARISON:  None. FINDINGS: Single-view chest demonstrates no acute airspace disease or effusion. Normal heart size with aortic atherosclerosis. No pneumothorax. Left rib series demonstrates acute eighth and ninth rib fractures. IMPRESSION: 1. Negative for pneumothorax or pleural effusion 2. Suspected acute left eighth and ninth rib fractures. Electronically Signed   By: Jasmine Pang M.D.   On: 05/05/2018 19:07    Procedures Procedures (including critical care time)  Medications Ordered in ED Medications  diclofenac (FLECTOR) 1.3 % 1 patch (has no administration in time range)     Initial Impression / Assessment and Plan / ED Course  I have reviewed the triage vital signs and the nursing notes.  Pertinent labs & imaging results that were available during my care of the patient were reviewed by me and considered in my medical decision making (see chart for details).  7:39 PM On repeat exam the patient is in no distress, resting. He awakens easily. He continues to have some pain in the left rib cage, but  otherwise no complaints. X-ray reviewed, then discussed with the patient, notable for 2 rib fracture, no pneumothorax. Patient will receive topical analgesia, with diclofenac, oral analgesia with Vicodin, be discharged with close outpatient follow-up.  Final Clinical Impressions(s) / ED Diagnoses   Final diagnoses:  Fall  Rib fracture, multiple, left, initial encounter    Gerhard Munch, MD 05/05/18 1940

## 2018-05-05 NOTE — Discharge Instructions (Signed)
Please use provided medication as directed for pain control, and ibuprofen for additional relief. Return here for concerning changes in your condition.

## 2018-05-24 DIAGNOSIS — G479 Sleep disorder, unspecified: Secondary | ICD-10-CM | POA: Diagnosis not present

## 2018-05-24 DIAGNOSIS — Z23 Encounter for immunization: Secondary | ICD-10-CM | POA: Diagnosis not present

## 2018-05-24 DIAGNOSIS — Z1159 Encounter for screening for other viral diseases: Secondary | ICD-10-CM | POA: Diagnosis not present

## 2018-05-24 DIAGNOSIS — E559 Vitamin D deficiency, unspecified: Secondary | ICD-10-CM | POA: Diagnosis not present

## 2018-05-24 DIAGNOSIS — R5383 Other fatigue: Secondary | ICD-10-CM | POA: Diagnosis not present

## 2018-05-24 DIAGNOSIS — Z0001 Encounter for general adult medical examination with abnormal findings: Secondary | ICD-10-CM | POA: Diagnosis not present

## 2018-05-24 DIAGNOSIS — I10 Essential (primary) hypertension: Secondary | ICD-10-CM | POA: Diagnosis not present

## 2018-05-24 DIAGNOSIS — H538 Other visual disturbances: Secondary | ICD-10-CM | POA: Diagnosis not present

## 2018-06-28 ENCOUNTER — Ambulatory Visit (INDEPENDENT_AMBULATORY_CARE_PROVIDER_SITE_OTHER): Payer: Self-pay | Admitting: Nurse Practitioner

## 2018-06-28 DIAGNOSIS — E559 Vitamin D deficiency, unspecified: Secondary | ICD-10-CM | POA: Diagnosis not present

## 2018-06-28 DIAGNOSIS — H538 Other visual disturbances: Secondary | ICD-10-CM | POA: Diagnosis not present

## 2018-06-28 DIAGNOSIS — Z712 Person consulting for explanation of examination or test findings: Secondary | ICD-10-CM | POA: Diagnosis not present

## 2018-06-28 DIAGNOSIS — R0782 Intercostal pain: Secondary | ICD-10-CM | POA: Diagnosis not present

## 2018-07-15 ENCOUNTER — Encounter: Payer: Self-pay | Admitting: Internal Medicine

## 2018-07-15 ENCOUNTER — Encounter: Payer: Self-pay | Admitting: Cardiovascular Disease

## 2018-08-04 DIAGNOSIS — T782XXA Anaphylactic shock, unspecified, initial encounter: Secondary | ICD-10-CM | POA: Diagnosis not present

## 2018-08-09 DIAGNOSIS — E559 Vitamin D deficiency, unspecified: Secondary | ICD-10-CM | POA: Diagnosis not present

## 2018-08-09 DIAGNOSIS — I10 Essential (primary) hypertension: Secondary | ICD-10-CM | POA: Diagnosis not present

## 2018-08-17 ENCOUNTER — Telehealth: Payer: Self-pay | Admitting: Cardiology

## 2018-08-17 NOTE — Telephone Encounter (Signed)
Virtual Visit Pre-Appointment Phone Call  "(Name), I am calling you today to discuss your upcoming appointment. We are currently trying to limit exposure to the virus that causes COVID-19 by seeing patients at home rather than in the office."  1. "What is the BEST phone number to call the day of the visit?" - include this in appointment notes  2. Do you have or have access to (through a family member/friend) a smartphone with video capability that we can use for your visit?" a. If yes - list this number in appt notes as cell (if different from BEST phone #) and list the appointment type as a VIDEO visit in appointment notes b. If no - list the appointment type as a PHONE visit in appointment notes  3. Confirm consent - "In the setting of the current Covid19 crisis, you are scheduled for a (phone or video) visit with your provider on (date) at (time).  Just as we do with many in-office visits, in order for you to participate in this visit, we must obtain consent.  If you'd like, I can send this to your mychart (if signed up) or email for you to review.  Otherwise, I can obtain your verbal consent now.  All virtual visits are billed to your insurance company just like a normal visit would be.  By agreeing to a virtual visit, we'd like you to understand that the technology does not allow for your provider to perform an examination, and thus may limit your provider's ability to fully assess your condition. If your provider identifies any concerns that need to be evaluated in person, we will make arrangements to do so.  Finally, though the technology is pretty good, we cannot assure that it will always work on either your or our end, and in the setting of a video visit, we may have to convert it to a phone-only visit.  In either situation, we cannot ensure that we have a secure connection.  Are you willing to proceed?" STAFF: Did the patient verbally acknowledge consent to telehealth visit? Document  YES/NO here: Yes  4. Advise patient to be prepared - "Two hours prior to your appointment, go ahead and check your blood pressure, pulse, oxygen saturation, and your weight (if you have the equipment to check those) and write them all down. When your visit starts, your provider will ask you for this information. If you have an Apple Watch or Kardia device, please plan to have heart rate information ready on the day of your appointment. Please have a pen and paper handy nearby the day of the visit as well."  5. Give patient instructions for MyChart download to smartphone OR [email or web address] as below if video visit (depending on what platform provider is using)  6. Inform patient they will receive a phone call 15 minutes prior to their appointment time (may be from unknown caller ID) so they should be prepared to answer    TELEPHONE CALL NOTE  Warren JewettRichard W Patrick has been deemed a candidate for a follow-up tele-health visit to limit community exposure during the Covid-19 pandemic. I spoke with the patient via phone to ensure availability of phone/video source, confirm preferred email & phone number, and discuss instructions and expectations.  I reminded Warren Jewettichard W Loren to be prepared with any vital sign and/or heart rhythm information that could potentially be obtained via home monitoring, at the time of his visit. I reminded Warren Jewettichard W Witters to expect a phone call prior to  his visit.  Virgel Gess Goins 08/17/2018 11:41 AM

## 2018-08-19 ENCOUNTER — Telehealth: Payer: Medicare Other | Admitting: Cardiology

## 2018-08-22 ENCOUNTER — Encounter: Payer: Self-pay | Admitting: Cardiology

## 2018-09-25 NOTE — Progress Notes (Signed)
Cardiology Consultation:   Patient ID: Warren Patrick MRN: [id number]; DOB: 07/19/1955  Admit date: (Not on file) Date of Consult: 09/27/2018  Patrick Care Provider: Wilson Patrick, Warren C, Patrick Patrick Cardiologist: Warren Patrick Electrophysiologist:  None     Patient Profile:   Warren Patrick is a 63 y.o. male with a hx of ETOH abuse, drug seeking behavior HTN and PTSD who is being seen today for the evaluation of chest pain  at the request of Warren Patrick.  History of Present Illness:   Mr. Warren Patrick 63 y.o. with above history . Referred for atypical chest pain . Reviewed Patrick office note from 05/24/18 and 06/28/18  Patient just established had stopped all his meds in February Smokes 3/4 ppd for over 50 years Gets chest pain intermittently about twice / week Stabbing pain that only lasts seconds No associated symptoms and pain not always related to exertion Uses alcohol to help sleep Also wanted xanax Did fall in February and fractured some left sided ribs   Heart Pathway Score:     Past Medical History:  Diagnosis Date  . Drug-seeking behavior   . ETOH abuse   . Hematuria, microscopic   . HTN (hypertension)   . PTSD (post-traumatic stress disorder)     Past Surgical History:  Procedure Laterality Date  . APPENDECTOMY    . ROTATOR CUFF REPAIR     right shoulder, on 10/2013  . TONSILLECTOMY       Home Medications:  Prior to Admission medications   Medication Sig Start Date End Date Taking? Authorizing Provider  amLODipine (NORVASC) 10 MG tablet Take 1 tablet (10 mg total) by mouth daily. Dose change, d/c previous amlodipine prescription Patient taking differently: Take 10 mg by mouth daily.  07/06/16 07/06/17  Warren Patrick  atorvastatin (LIPITOR) 40 MG tablet Take 1 tablet (40 mg total) by mouth daily. 09/20/15   Warren Patrick  HYDROcodone-acetaminophen (NORCO/VICODIN) 5-325 MG tablet Take 1 tablet by mouth every 6 (six) hours as needed for severe pain. 05/05/18   Warren Patrick  ibuprofen (ADVIL,MOTRIN) 200 MG tablet Take 200 mg by mouth every 6 (six) hours as needed for moderate pain.    Provider, Historical, Patrick  nicotine (NICOTROL) 10 MG inhaler Inhale 1 Cartridge (1 continuous puffing total) into the lungs as needed for smoking cessation. Patient not taking: Reported on 03/30/2017 01/09/16   Warren Patrick  sertraline (ZOLOFT) 25 MG tablet Take 1 tablet (25 mg total) by mouth daily. 07/06/16   Warren Patrick    Inpatient Medications: Scheduled Meds:  Continuous Infusions:  PRN Meds:   Allergies:    Allergies  Allergen Reactions  . Bee Venom Swelling    Social History:   Social History   Socioeconomic History  . Marital status: Divorced    Spouse name: Not on file  . Number of children: Not on file  . Years of education: Not on file  . Highest education level: Not on file  Occupational History  . Not on file  Social Needs  . Financial resource strain: Not on file  . Food insecurity    Worry: Not on file    Inability: Not on file  . Transportation needs    Medical: Not on file    Non-medical: Not on file  Tobacco Use  . Smoking status: Current Every Day Smoker    Packs/day: 0.50    Types: Cigarettes  . Smokeless tobacco: Never Used  . Tobacco comment: 1/2PPD  Substance and Sexual Activity  . Alcohol use: Yes    Alcohol/week: 28.0 standard drinks    Types: 28 Standard drinks or equivalent per week    Comment: Mixed drinks daily.  . Drug use: No  . Sexual activity: Yes    Partners: Female  Lifestyle  . Physical activity    Days per week: Not on file    Minutes per session: Not on file  . Stress: Not on file  Relationships  . Social Musicianconnections    Talks on phone: Not on file    Gets together: Not on file    Attends religious service: Not on file    Active member of club or organization: Not on file    Attends meetings of clubs or organizations: Not on file    Relationship status: Not on file  . Intimate  partner violence    Fear of current or ex partner: Not on file    Emotionally abused: Not on file    Physically abused: Not on file    Forced sexual activity: Not on file  Other Topics Concern  . Not on file  Social History Narrative  . Not on file    Family History:    Family History  Problem Relation Age of Onset  . Cancer Mother        breast cancer  . Cancer Father        lung cancer  . Hypertension Father   . Cancer Brother        pancreatic cancer  . Diabetes Brother   . Diabetes Sister      ROS:  Please see the history of present illness.   All other ROS reviewed and negative.     Physical Exam/Data:   Vitals:   09/27/18 1421  BP: (!) 143/99  Pulse: (!) 106  Temp: 99 F (37.2 C)  Weight: 133 lb (60.3 kg)  Height: 5\' 9"  (1.753 m)   @IOBRIEF @ Last 3 Weights 09/27/2018 04/13/2016 01/27/2016  Weight (lbs) 133 lb 145 lb 3.2 oz 143 lb 9.6 oz  Weight (kg) 60.328 kg 65.862 kg 65.137 kg     Body mass index is 19.64 kg/m.  General:  Well nourished, well developed, in no acute distress  HEENT: normal Lymph: no adenopathy Neck: no JVD Endocrine:  No thryomegaly Vascular: No carotid bruits; FA pulses 2+ bilaterally without bruits  Cardiac:  normal S1, S2; RRR; no murmur   Lungs:  clear to auscultation bilaterally, no wheezing, rhonchi or rales  Abd: soft, nontender, no hepatomegaly  Ext: no edema Musculoskeletal:  No deformities, BUE and BLE strength normal and equal Skin: warm and dry  Neuro:  CNs 2-12 intact, no focal abnormalities noted Psych:  Normal affect   EKG:  03/24/17 SR rate 67 normal ECG    Relevant CV Studies: None  Laboratory Data:  High Sensitivity Troponin:  No results for input(s): TROPONINIHS in the last 720 hours.   Cardiac EnzymesNo results for input(s): TROPONINI in the last 168 hours. No results for input(s): TROPIPOC in the last 168 hours.  ChemistryNo results for input(s): NA, K, CL, CO2, GLUCOSE, BUN, CREATININE, CALCIUM,  GFRNONAA, GFRAA, ANIONGAP in the last 168 hours.  No results for input(s): PROT, ALBUMIN, AST, ALT, ALKPHOS, BILITOT in the last 168 hours. HematologyNo results for input(s): WBC, RBC, HGB, HCT, MCV, MCH, MCHC, RDW, PLT in the last 168 hours. BNPNo results for input(s): BNP, PROBNP in the last 168 hours.  DDimer No results for  input(s): DDIMER in the last 168 hours.   Radiology/Studies:  No results found.  Assessment and Plan:   1. Chest Pain:  Atypical normal ECG f/u ETT myovue  2. ETOH:  Counseled on cessation and moderation long term effects regarding liver, cognition, balance and cardiomyopathy discussed Echo to r/o DCM  3. HTN:  Well controlled.  Continue current medications and low sodium Dash type diet.   4. HLD:  Continue statin labs with Patrick  5. Anxiety/Depression PTSD  On zoloft f/u Patrick  6. COPD: long history and active smoking CXR today has not had one in long time can f/u with Warren Anastasio Champion To consider lung cancer screening CT    For questions or updates, please contact [place] Please consult [email or web address] for contact info under     Signed, Jenkins Rouge, Patrick  09/27/2018 2:57 PM

## 2018-09-27 ENCOUNTER — Other Ambulatory Visit: Payer: Self-pay

## 2018-09-27 ENCOUNTER — Ambulatory Visit (HOSPITAL_COMMUNITY)
Admission: RE | Admit: 2018-09-27 | Discharge: 2018-09-27 | Disposition: A | Discharge status: Home / Self Care | Payer: Medicare Other | Source: Ambulatory Visit | Attending: Cardiovascular Disease | Admitting: Cardiovascular Disease

## 2018-09-27 ENCOUNTER — Ambulatory Visit (INDEPENDENT_AMBULATORY_CARE_PROVIDER_SITE_OTHER): Payer: Medicare Other | Admitting: Cardiovascular Disease

## 2018-09-27 ENCOUNTER — Encounter

## 2018-09-27 ENCOUNTER — Encounter: Payer: Self-pay | Admitting: Cardiovascular Disease

## 2018-09-27 VITALS — BP 143/99 | HR 106 | Temp 99.0°F | Ht 69.0 in | Wt 133.0 lb

## 2018-09-27 DIAGNOSIS — J449 Chronic obstructive pulmonary disease, unspecified: Secondary | ICD-10-CM | POA: Diagnosis not present

## 2018-09-27 DIAGNOSIS — R079 Chest pain, unspecified: Secondary | ICD-10-CM | POA: Diagnosis not present

## 2018-09-27 DIAGNOSIS — R0602 Shortness of breath: Secondary | ICD-10-CM | POA: Diagnosis not present

## 2018-09-27 DIAGNOSIS — I429 Cardiomyopathy, unspecified: Secondary | ICD-10-CM | POA: Diagnosis not present

## 2018-09-27 NOTE — Patient Instructions (Addendum)
Medication Instructions:  Your physician recommends that you continue on your current medications as directed. Please refer to the Current Medication list given to you today.   Labwork: none  Testing/Procedures: Your physician has requested that you have an echocardiogram. Echocardiography is a painless test that uses sound waves to create images of your heart. It provides your doctor with information about the size and shape of your heart and how well your heart's chambers and valves are working. This procedure takes approximately one hour. There are no restrictions for this procedure.  A chest x-ray takes a picture of the organs and structures inside the chest, including the heart, lungs, and blood vessels. This test can show several things, including, whether the heart is enlarges; whether fluid is building up in the lungs; and whether pacemaker / defibrillator leads are still in place.  Your physician has requested that you have en exercise stress myoview. For further information please visit [email or web address]. Please follow instruction sheet, as given.      Follow-Up: Your physician recommends that you schedule a follow-up appointment in: to be determined    Any Other Special Instructions Will Be Listed Below (If Applicable).     If you need a refill on your cardiac medications before your next appointment, please call your pharmacy.

## 2018-10-03 ENCOUNTER — Telehealth: Payer: Self-pay

## 2018-10-03 ENCOUNTER — Telehealth: Payer: Self-pay | Admitting: Cardiovascular Disease

## 2018-10-03 DIAGNOSIS — Z01818 Encounter for other preprocedural examination: Secondary | ICD-10-CM

## 2018-10-03 NOTE — Telephone Encounter (Signed)
I will speak with B.Strader PA-C to see if test can be converted to lexiscan

## 2018-10-03 NOTE — Telephone Encounter (Signed)
LM for patient to call back and confirm he wants a lexiscan

## 2018-10-03 NOTE — Telephone Encounter (Signed)
    The test was ordered by Dr. Johnsie Cancel. I do not see any contraindications to Lexiscan if the patient wishes to have that instead of walking on the treadmill. He does have COPD by review of notes so if he has a rescue inhaler, would just encourage him to bring with him no matter what type of stress test.   Signed, Erma Heritage, PA-C 10/03/2018, 4:16 PM Pager: [phone]

## 2018-10-03 NOTE — Telephone Encounter (Signed)
LM on home and cell to call back, needs COVID testing today for cardiology test on 7/22

## 2018-10-03 NOTE — Telephone Encounter (Signed)
New message    Patient returned your call, he does not have transportation today to go have the Covid 19 testing done.

## 2018-10-04 ENCOUNTER — Other Ambulatory Visit: Payer: Self-pay

## 2018-10-04 DIAGNOSIS — Z20822 Contact with and (suspected) exposure to covid-19: Secondary | ICD-10-CM

## 2018-10-04 DIAGNOSIS — R6889 Other general symptoms and signs: Secondary | ICD-10-CM | POA: Diagnosis not present

## 2018-10-04 NOTE — Telephone Encounter (Signed)
Pt states he would like to have lexiscan tomorrow

## 2018-10-05 ENCOUNTER — Other Ambulatory Visit: Payer: Self-pay

## 2018-10-05 ENCOUNTER — Encounter (HOSPITAL_COMMUNITY): Payer: Medicare Other

## 2018-10-05 ENCOUNTER — Ambulatory Visit (HOSPITAL_COMMUNITY)
Admission: RE | Admit: 2018-10-05 | Discharge: 2018-10-05 | Disposition: A | Discharge status: Home / Self Care | Payer: Medicare Other | Source: Ambulatory Visit | Attending: Cardiovascular Disease | Admitting: Cardiovascular Disease

## 2018-10-05 ENCOUNTER — Ambulatory Visit (HOSPITAL_BASED_OUTPATIENT_CLINIC_OR_DEPARTMENT_OTHER)
Admission: RE | Admit: 2018-10-05 | Discharge: 2018-10-05 | Disposition: A | Discharge status: Home / Self Care | Payer: Medicare Other | Source: Ambulatory Visit | Attending: Cardiovascular Disease | Admitting: Cardiovascular Disease

## 2018-10-05 ENCOUNTER — Encounter (HOSPITAL_COMMUNITY)
Admission: RE | Admit: 2018-10-05 | Discharge: 2018-10-05 | Disposition: A | Discharge status: Home / Self Care | Payer: Medicare Other | Source: Ambulatory Visit | Attending: Cardiovascular Disease | Admitting: Cardiovascular Disease

## 2018-10-05 DIAGNOSIS — R079 Chest pain, unspecified: Secondary | ICD-10-CM | POA: Insufficient documentation

## 2018-10-05 DIAGNOSIS — J449 Chronic obstructive pulmonary disease, unspecified: Secondary | ICD-10-CM | POA: Diagnosis not present

## 2018-10-05 LAB — NM MYOCAR MULTI W/SPECT W/WALL MOTION / EF
LV dias vol: 92 mL (ref 62–150)
LV sys vol: 34 mL
Peak HR: 91 {beats}/min
RATE: 0.34
Rest HR: 72 {beats}/min
SDS: 0
SRS: 1
SSS: 1
TID: 1.04

## 2018-10-05 MED ORDER — TECHNETIUM TC 99M TETROFOSMIN IV KIT
30.0000 | PACK | Freq: Once | INTRAVENOUS | Status: AC | PRN
Start: 2018-10-05 — End: 2018-10-05
  Administered 2018-10-05: 30 via INTRAVENOUS

## 2018-10-05 MED ORDER — SODIUM CHLORIDE 0.9% FLUSH
INTRAVENOUS | Status: AC
  Administered 2018-10-05: 10 mL via INTRAVENOUS
  Filled 2018-10-05: qty 10

## 2018-10-05 MED ORDER — TECHNETIUM TC 99M TETROFOSMIN IV KIT
10.0000 | PACK | Freq: Once | INTRAVENOUS | Status: AC | PRN
  Administered 2018-10-05: 11 via INTRAVENOUS

## 2018-10-05 MED ORDER — REGADENOSON 0.4 MG/5ML IV SOLN
INTRAVENOUS | Status: AC
  Administered 2018-10-05: 0.08 mg via INTRAVENOUS
  Filled 2018-10-05: qty 5

## 2018-10-05 NOTE — Progress Notes (Signed)
*  PRELIMINARY RESULTS* Echocardiogram 2D Echocardiogram has been performed.  Samuel Germany 10/05/2018, 2:45 PM

## 2018-10-06 LAB — NOVEL CORONAVIRUS, NAA: SARS-CoV-2, NAA: NOT DETECTED

## 2018-10-11 ENCOUNTER — Encounter (HOSPITAL_COMMUNITY): Payer: Medicare Other

## 2018-10-12 ENCOUNTER — Telehealth (INDEPENDENT_AMBULATORY_CARE_PROVIDER_SITE_OTHER): Payer: Self-pay | Admitting: Internal Medicine

## 2018-10-12 NOTE — Telephone Encounter (Signed)
Informed pt of negative covid result   °

## 2019-01-26 ENCOUNTER — Encounter (INDEPENDENT_AMBULATORY_CARE_PROVIDER_SITE_OTHER): Payer: Self-pay | Admitting: Internal Medicine

## 2019-01-26 ENCOUNTER — Other Ambulatory Visit: Payer: Self-pay

## 2019-01-26 ENCOUNTER — Ambulatory Visit (INDEPENDENT_AMBULATORY_CARE_PROVIDER_SITE_OTHER): Payer: Medicare Other | Admitting: Internal Medicine

## 2019-01-26 VITALS — BP 120/70 | HR 84 | Temp 98.4°F | Resp 18 | Ht 67.0 in | Wt 136.0 lb

## 2019-01-26 DIAGNOSIS — Z23 Encounter for immunization: Secondary | ICD-10-CM

## 2019-01-26 DIAGNOSIS — I10 Essential (primary) hypertension: Secondary | ICD-10-CM

## 2019-01-26 DIAGNOSIS — E559 Vitamin D deficiency, unspecified: Secondary | ICD-10-CM | POA: Diagnosis not present

## 2019-01-26 NOTE — Progress Notes (Signed)
Metrics: Intervention Frequency ACO  Documented Smoking Status Yearly  Screened one or more times in 24 months  Cessation Counseling or  Active cessation medication Past 24 months  Past 24 months   Guideline developer: UpToDate (See UpToDate for funding source) Date Released: 2014       Wellness Office Visit  Subjective:  Patient ID: Warren Patrick, male    DOB: 1955/09/01  Age: 63 y.o. MRN: [id number]  CC: This man comes in for follow-up of hypertension, vitamin D deficiency. HPI  He is doing quite well although he was incarcerated for almost 3 months from August and November, the details of which I am not certain off. He continues to take amlodipine for his hypertension.  He denies any chest pain, dyspnea, palpitations or limb weakness. He has been taking vitamin D3 supplementation for vitamin D deficiency. He was also noted in the past to have a slightly elevated liver enzyme and we will follow up with this.  He did have hepatitis C antibody positive but the RNA was negative so it was probably a past infection. HIV testing in the past has been negative. Past Medical History:  Diagnosis Date  . Drug-seeking behavior   . ETOH abuse   . Hematuria, microscopic   . HTN (hypertension)   . PTSD (post-traumatic stress disorder)       Family History  Problem Relation Age of Onset  . Cancer Mother        breast cancer  . Cancer Father        lung cancer  . Hypertension Father   . Cancer Brother        pancreatic cancer  . Diabetes Brother   . 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 Lives with current fiance, have been in a relationship for 7 years.   Social History   Tobacco Use  . Smoking status: Current Every Day Smoker    Packs/day: 0.50    Types: Cigarettes  . Smokeless tobacco: Never Used  . Tobacco comment: 1/2PPD   Substance Use Topics  . Alcohol use: Yes    Alcohol/week: 28.0 standard drinks    Types: 28 Standard drinks or equivalent per week    Comment: Mixed drinks daily.    Current Meds  Medication Sig  . amLODipine (NORVASC) 5 MG tablet Take 5 mg by mouth daily.   . Cholecalciferol (VITAMIN D-3) 125 MCG (5000 UT) TABS Take 1 tablet by mouth daily.  Marland Kitchen EPIPEN 2-PAK 0.3 MG/0.3ML SOAJ injection      Objective:   Today's Vitals: BP 120/70 (BP Location: Left Arm, Patient Position: Sitting, Cuff Size: Normal)   Pulse 84   Temp 98.4 F (36.9 C) (Temporal)   Resp 18   Ht _0  (1.702 m)   Wt 136 lb (61.7 kg)   SpO2 98% Comment: with mask.  BMI 21.30 kg/m  Vitals with BMI 01/26/2019 09/27/2018 05/05/2018  Height _1  _2  -  Weight 136 lbs 133 lbs -  BMI 80.9 98.33 -  Systolic 825 053 976  Diastolic 70 99 734  Pulse 84 106 82     Physical Exam   He looks systemically well.  Blood pressure is well controlled.  No new focal neurological signs.  He is alert and orientated.    Assessment   1. Essential  hypertension   2. Vitamin D deficiency disease       Tests ordered Orders Placed This Encounter  Procedures  . CMP with eGFR([facility])  . Vitamin D, 25-hydroxy     Plan: 1. He will continue with amlodipine for his hypertension. 2. He will continue vitamin D3 supplementation and we will check levels today. 3. He was given influenza vaccination today. 4. He will follow-up with Judson Roch in about 3 months time and further recommendations will depend on blood results.   No orders of the defined types were placed in this encounter.   Doree Albee, MD

## 2019-01-27 ENCOUNTER — Encounter (INDEPENDENT_AMBULATORY_CARE_PROVIDER_SITE_OTHER): Payer: Self-pay | Admitting: Internal Medicine

## 2019-01-27 LAB — COMPLETE METABOLIC PANEL WITH GFR
AG Ratio: 1.8 (calc) (ref 1.0–2.5)
ALT: 28 U/L (ref 9–46)
AST: 42 U/L — ABNORMAL HIGH (ref 10–35)
Albumin: 4.2 g/dL (ref 3.6–5.1)
Alkaline phosphatase (APISO): 48 U/L (ref 35–144)
BUN: 12 mg/dL (ref 7–25)
CO2: 27 mmol/L (ref 20–32)
Calcium: 9.2 mg/dL (ref 8.6–10.3)
Chloride: 102 mmol/L (ref 98–110)
Creat: 0.96 mg/dL (ref 0.70–1.25)
GFR, Est African American: 96 mL/min/{1.73_m2} (ref >=60)
GFR, Est Non African American: 83 mL/min/{1.73_m2} (ref >=60)
Globulin: 2.3 g/dL (calc) (ref 1.9–3.7)
Glucose, Bld: 81 mg/dL (ref 65–99)
Potassium: 3.6 mmol/L (ref 3.5–5.3)
Sodium: 141 mmol/L (ref 135–146)
Total Bilirubin: 0.6 mg/dL (ref 0.2–1.2)
Total Protein: 6.5 g/dL (ref 6.1–8.1)

## 2019-01-27 LAB — VITAMIN D 25 HYDROXY (VIT D DEFICIENCY, FRACTURES): Vit D, 25-Hydroxy: 32 ng/mL (ref 30–100)

## 2019-04-20 ENCOUNTER — Telehealth (INDEPENDENT_AMBULATORY_CARE_PROVIDER_SITE_OTHER): Payer: Self-pay | Admitting: Internal Medicine

## 2019-04-20 DIAGNOSIS — I10 Essential (primary) hypertension: Secondary | ICD-10-CM

## 2019-04-20 MED ORDER — AMLODIPINE BESYLATE 5 MG PO TABS: 5.0000 mg | ORAL_TABLET | Freq: Every day | ORAL | 0 refills | Status: DC

## 2019-04-20 NOTE — Telephone Encounter (Signed)
Refill sent.

## 2019-05-04 ENCOUNTER — Encounter (INDEPENDENT_AMBULATORY_CARE_PROVIDER_SITE_OTHER): Payer: Self-pay | Admitting: Nurse Practitioner

## 2019-05-04 ENCOUNTER — Ambulatory Visit (INDEPENDENT_AMBULATORY_CARE_PROVIDER_SITE_OTHER): Payer: Medicare HMO | Admitting: Nurse Practitioner

## 2019-05-04 ENCOUNTER — Other Ambulatory Visit: Payer: Self-pay

## 2019-05-04 VITALS — BP 138/91 | HR 76

## 2019-05-04 DIAGNOSIS — R252 Cramp and spasm: Secondary | ICD-10-CM | POA: Insufficient documentation

## 2019-05-04 DIAGNOSIS — M79645 Pain in left finger(s): Secondary | ICD-10-CM | POA: Diagnosis not present

## 2019-05-04 DIAGNOSIS — I10 Essential (primary) hypertension: Secondary | ICD-10-CM

## 2019-05-04 DIAGNOSIS — E785 Hyperlipidemia, unspecified: Secondary | ICD-10-CM | POA: Diagnosis not present

## 2019-05-04 DIAGNOSIS — R079 Chest pain, unspecified: Secondary | ICD-10-CM | POA: Diagnosis not present

## 2019-05-04 DIAGNOSIS — E559 Vitamin D deficiency, unspecified: Secondary | ICD-10-CM

## 2019-05-04 MED ORDER — IBUPROFEN 800 MG PO TABS: 800.0000 mg | ORAL_TABLET | Freq: Three times a day (TID) | ORAL | 0 refills | Status: DC | PRN

## 2019-05-04 MED ORDER — AMLODIPINE BESYLATE 5 MG PO TABS: 5.0000 mg | ORAL_TABLET | Freq: Every day | ORAL | 0 refills | Status: DC

## 2019-05-04 NOTE — Assessment & Plan Note (Signed)
He will continue on his vitamin D supplement and I will add serum vitamin D level to his blood work that he will get collected in approximately 1 week.

## 2019-05-04 NOTE — Assessment & Plan Note (Signed)
He has undergone thorough cardiac work-up.  As I really review his cardiac echocardiogram I notes that there are signs of possible pulmonary hypertension.  I will follow-up this patient in a couple of days to discuss his lab work results, I will also discussed possibly referring him to pulmonology for further evaluation of possible pulmonary hypertension.

## 2019-05-04 NOTE — Progress Notes (Signed)
An audio/visual tele-health visit was felt to be the most appropriate encounter type for this patient today. I connected with  Warren Patrick on 05/04/19 utilizing audio/visual technology and verified that I am speaking with the correct person using two identifiers. The patient was located at their home, and I was located at home during the encounter. I discussed the limitations of evaluation and management by telemedicine. The patient expressed understanding and agreed to proceed.     Subjective:  Patient ID: Warren Patrick, male    DOB: 03-31-1955  Age: 64 y.o. MRN: [id number]  CC:  Chief Complaint  Patient presents with  . Follow-up    Vitamin D deficiency  . Hyperlipidemia  . Hypertension  . Other    Muscle cramps, chest pain, finger pain     HPI  This patient presents for virtual office visit today for the above.   Vitamin D deficiency: He has a history of vitamin D deficiency.  Last serum level was collected in November 2020 and it was 42.  At that time he was told to increase his daily intake to 10,000 IUs.  He tells me he is taking anywhere between 6000 to 10,000 IUs of vitamin D3 daily.  Hyperlipidemia: He also has a history of hyperlipidemia.  Last lipid panel was collected in March 2020.  At that time he tells me he was not on any medication for his cholesterol.  His panel at that time was normal with a total cholesterol of 169, HDL of 58, triglycerides 74, and LDLs of 95.  Hypertension: He also has a history of hypertension.  He has been prescribed amlodipine in the past.  He does not take this regularly as prescribed.  He tells me he has not taken this for approximately 3 weeks.  Muscle cramps: He also complains today of muscle cramps.  He tells me they have been going on for approximately 7 months.  They occur in his arms and legs.  He tells me the pain is severe and feels like a shooting, stabbing pain.  He tells me he will experience this pain about twice a day  for 3 to 6 seconds at a time.  He tells me that he also feels like he might have some weakness and numbness in his limbs when this pain occurs.  Chest pain: He also mentions that he has intermittent chest pain twice a day.  He has been evaluated by cardiology within the last year.  He did undergo cardiac echocardiogram and stress testing.  The patient tells me both test came back normal.  Finger pain: He also tells me that about 1 month ago he broke the third digit to his left hand.  He tells me he still has some swelling and severe pain that radiates from his knuckle to his fingernail.  He is wondering what he can take for pain relief.  He denies having undergone any evaluation or imaging related to his finger pain when he thought he broke it.   Past Medical History:  Diagnosis Date  . Drug-seeking behavior   . ETOH abuse   . Hematuria, microscopic   . HTN (hypertension)   . PTSD (post-traumatic stress disorder)   . Vitamin D deficiency       Family History  Problem Relation Age of Onset  . Cancer Mother        breast cancer  . Cancer Father        lung cancer  . Hypertension  Father   . Cancer Brother        pancreatic cancer  . Diabetes Brother   . 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 Lives with current fiance, have been in a relationship for 7 years.   Social History   Tobacco Use  . Smoking status: Current Every Day Smoker    Packs/day: 0.50    Types: Cigarettes  . Smokeless tobacco: Never Used  . Tobacco comment: 1/2PPD  Substance Use Topics  . Alcohol use: Yes    Alcohol/week: 28.0 standard drinks    Types: 28 Standard drinks or equivalent per week    Comment: Mixed drinks daily.     Current Meds  Medication Sig  . Cholecalciferol (VITAMIN D-3) 125 MCG (5000 UT) TABS Take 1 tablet by mouth daily. Taking  6000-10,000IUs daily  . EPIPEN 2-PAK 0.3 MG/0.3ML SOAJ injection     ROS:  Review of Systems  Constitutional: Negative for fever and malaise/fatigue.  Eyes: Positive for blurred vision (chronic and stable in right eye).  Respiratory: Negative for cough, shortness of breath and wheezing.   Cardiovascular: Positive for chest pain. Negative for palpitations.  Musculoskeletal: Positive for joint pain.  Neurological: Positive for sensory change and weakness. Negative for dizziness and headaches.     Objective:   Today's Vitals: BP (!) 138/91   Pulse 76  Vitals with BMI 05/04/2019 05/04/2019 01/26/2019  Height - - '5\' 7"'   Weight - - 136 lbs  BMI - - 74.2  Systolic 595 638 756  Diastolic 91 433 70  Pulse - 76 84     Physical Exam Comprehensive physical exam not completed today as office visit was conducted remotely.  Patient did appear well on video.  He answered questions appropriately..  Patient was alert and oriented, and appeared to have appropriate judgment.       Assessment   1. Hyperlipidemia, unspecified hyperlipidemia type   2. Essential hypertension   3. Finger pain, left   4. Vitamin D deficiency   5. Chest pain, unspecified type   6. Muscle cramps       Tests ordered Orders Placed This Encounter  Procedures  . CMP with eGFR([facility])  . Vitamin D, 25-hydroxy  . Lipid Panel  . Magnesium     Plan: Please see assessment and plan per problem list below.   Meds ordered this encounter  Medications  . amLODipine (NORVASC) 5 MG tablet    Sig: Take 1 tablet (5 mg total) by mouth daily.    Dispense:  90 tablet    Refill:  0    Order Specific Question:   Supervising Provider    Answer:   Hurshel Party C [[id number]]  . ibuprofen (ADVIL) 800 MG tablet    Sig: Take 1 tablet (800 mg total) by mouth every 8 (eight) hours as needed.    Dispense:  30 tablet    Refill:  0    Order Specific Question:   Supervising Provider    Answer:   Doree Albee [[id number]]     Patient to follow-up in patient will follow up in approximately 6 weeks for close follow-up regarding his concerns related to his muscle cramps, chest pain, questionably broken finger, and chronic conditions.  Ailene Ards, NP

## 2019-05-04 NOTE — Assessment & Plan Note (Signed)
I will send him to get blood work collected and check his metabolic panel for further evaluation.  Further recommendations will be made based upon those results.

## 2019-05-04 NOTE — Assessment & Plan Note (Signed)
Blood pressure elevated on patient's at home cuff today.  I will represcribe his amlodipine and recommended that he take this daily as prescribed.

## 2019-05-04 NOTE — Assessment & Plan Note (Signed)
As for his finger pain.  I do see that his finger looks swollen on video.  I will prescribe him ibuprofen that he can take by mouth 3 times a day with food.  I did educate him to take it only as needed and he can stop taking this when he is feeling better.  May need to consider imaging if his pain does not improve.

## 2019-05-04 NOTE — Assessment & Plan Note (Signed)
We will send patient for blood work next week.  Will include lipid panel at that time for further evaluation.  Patient was told to fast in preparation for this test.

## 2019-05-23 ENCOUNTER — Telehealth (INDEPENDENT_AMBULATORY_CARE_PROVIDER_SITE_OTHER): Payer: Self-pay

## 2019-05-23 NOTE — Telephone Encounter (Signed)
Warren Patrick is calling stating that his Blood Pressures have been running 180 to 195 over 110-125 and he has started taking the Amlodipine 5 mg twice a day and BP is running today after taking 10 mg it was 125/84 please advise?

## 2019-05-24 NOTE — Telephone Encounter (Signed)
What he is doing is reasonable by taking amlodipine 5 mg twice a day.  He should continue with the same dose until he is seen towards the end of the month when he has an appointment.

## 2019-05-24 NOTE — Telephone Encounter (Signed)
Warren Patrick is aware and agrees

## 2019-06-12 ENCOUNTER — Encounter (INDEPENDENT_AMBULATORY_CARE_PROVIDER_SITE_OTHER): Payer: Self-pay | Admitting: Internal Medicine

## 2019-06-12 ENCOUNTER — Other Ambulatory Visit: Payer: Self-pay

## 2019-06-12 ENCOUNTER — Ambulatory Visit (INDEPENDENT_AMBULATORY_CARE_PROVIDER_SITE_OTHER): Payer: Medicare HMO | Admitting: Internal Medicine

## 2019-06-12 VITALS — BP 140/86 | HR 88 | Temp 98.8°F | Resp 18 | Ht 69.0 in | Wt 142.8 lb

## 2019-06-12 DIAGNOSIS — M79662 Pain in left lower leg: Secondary | ICD-10-CM

## 2019-06-12 DIAGNOSIS — I10 Essential (primary) hypertension: Secondary | ICD-10-CM | POA: Diagnosis not present

## 2019-06-12 DIAGNOSIS — B351 Tinea unguium: Secondary | ICD-10-CM

## 2019-06-12 DIAGNOSIS — M79661 Pain in right lower leg: Secondary | ICD-10-CM

## 2019-06-12 DIAGNOSIS — E785 Hyperlipidemia, unspecified: Secondary | ICD-10-CM | POA: Diagnosis not present

## 2019-06-12 DIAGNOSIS — E559 Vitamin D deficiency, unspecified: Secondary | ICD-10-CM | POA: Diagnosis not present

## 2019-06-12 MED ORDER — AMLODIPINE BESYLATE 10 MG PO TABS: 10.0000 mg | ORAL_TABLET | Freq: Every day | ORAL | 0 refills | Status: DC

## 2019-06-12 MED ORDER — MELATONIN 1 MG PO CAPS: 1.0000 mg | ORAL_CAPSULE | Freq: Every day | ORAL | 3 refills | Status: DC

## 2019-06-12 NOTE — Progress Notes (Signed)
Metrics: Intervention Frequency ACO  Documented Smoking Status Yearly  Screened one or more times in 24 months  Cessation Counseling or  Active cessation medication Past 24 months  Past 24 months   Guideline developer: UpToDate (See UpToDate for funding source) Date Released: 2014       Wellness Office Visit  Subjective:  Patient ID: Warren Patrick, male    DOB: [birth year]  Age: 64 y.o. MRN: [id number]  CC: This man comes in for follow-up of hypertension, vitamin D deficiency, hyperlipidemia.  HPI  He is now concerned about bilateral painful legs.  He said he has had injuries to his legs which is caused nerve damage.  He wants to see if I can prescribe him any pain medicine such as gabapentin.  He also discussed the possibility of tramadol. He did increase his amlodipine from 5 mg daily to 10 mg daily as his blood pressure was remaining elevated.  I agreed with this change. He is also concerned about his toenails especially the big toes which appear to have fungus. Past Medical History:  Diagnosis Date  . Drug-seeking behavior   . ETOH abuse   . Hematuria, microscopic   . HTN (hypertension)   . PTSD (post-traumatic stress disorder)   . Vitamin D deficiency       Family History  Problem Relation Age of Onset  . Cancer Mother        breast cancer  . Cancer Father        lung cancer  . Hypertension Father   . Cancer Brother        pancreatic cancer  . Diabetes Brother   . Diabetes Sister     Social History   Social History Narrative    Divorced. 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 Lives with current fiance, have been in a relationship for 7 years.   Social History   Tobacco Use  . Smoking status: Current Every Day Smoker    Packs/day: 0.50    Types: Cigarettes  . Smokeless tobacco: Never Used  . Tobacco comment: 1/2PPD  Substance Use Topics  . Alcohol use: Yes    Alcohol/week: 28.0  standard drinks    Types: 28 Standard drinks or equivalent per week    Comment: Mixed drinks daily.    Current Meds  Medication Sig  . amLODipine (NORVASC) 5 MG tablet Take 1 tablet (5 mg total) by mouth daily.      Objective:   Today's Vitals: BP 140/86 (BP Location: Left Arm, Patient Position: Sitting, Cuff Size: Normal)   Pulse 88   Temp 98.8 F (37.1 C) (Temporal)   Resp 18   Ht 5\' 9"  (1.753 m)   Wt 142 lb 12.8 oz (64.8 kg)   SpO2 98%   BMI 21.09 kg/m  Vitals with BMI 06/12/2019 05/04/2019 05/04/2019  Height 5\' 9"  - -  Weight 142 lbs 13 oz - -  BMI 21.08 - -  Systolic 140 138 05/06/2019  Diastolic 86 91 107  Pulse 88 - 76     Physical Exam   He looks systemically well.  Blood pressure still somewhat elevated but acceptable at this point.  He does have bilateral toenail fungus especially on the big toes.    Assessment   1. Hyperlipidemia, unspecified hyperlipidemia type   2. Vitamin D deficiency   3. Essential hypertension   4. Onychomycosis of great toe   5. Pain  in both lower legs       Tests ordered Orders Placed This Encounter  Procedures  . COMPLETE METABOLIC PANEL WITH GFR  . Lipid panel  . VITAMIN D 25 Hydroxy (Vit-D Deficiency, Fractures)  . Ambulatory referral to Podiatry  . Ambulatory referral to Neurology     Plan: 1. Blood work is ordered above. 2. He will continue with higher dose of amlodipine 10 mg daily and I have sent a new prescription for this. 3. I will refer him to podiatrist for onychomycosis of his big toes. 4. I will refer him to neurology for pain in his legs to make sure this is neuropathic pain.  I am not keen at all to prescribe him any pain medicine unless absolutely necessary. 5. Further recommendations will depend on blood results and I will have him follow-up with Sarah in about 3 months time.   Meds ordered this encounter  Medications  . Melatonin 1 MG CAPS    Sig: Take 1 capsule (1 mg total) by mouth at bedtime.     Dispense:  30 capsule    Refill:  3  . amLODipine (NORVASC) 10 MG tablet    Sig: Take 1 tablet (10 mg total) by mouth daily.    Dispense:  90 tablet    Refill:  0    Serria Sloma Luther Parody, MD

## 2019-06-13 LAB — LIPID PANEL
Cholesterol: 156 mg/dL (ref <200)
HDL: 56 mg/dL (ref >=40)
LDL Cholesterol (Calc): 79 mg/dL (calc)
Non-HDL Cholesterol (Calc): 100 mg/dL (calc) (ref <130)
Total CHOL/HDL Ratio: 2.8 (calc) (ref <5.0)
Triglycerides: 116 mg/dL (ref <150)

## 2019-06-13 LAB — COMPLETE METABOLIC PANEL WITH GFR
AG Ratio: 1.6 (calc) (ref 1.0–2.5)
ALT: 22 U/L (ref 9–46)
AST: 32 U/L (ref 10–35)
Albumin: 4.7 g/dL (ref 3.6–5.1)
Alkaline phosphatase (APISO): 48 U/L (ref 35–144)
BUN: 9 mg/dL (ref 7–25)
CO2: 30 mmol/L (ref 20–32)
Calcium: 10.1 mg/dL (ref 8.6–10.3)
Chloride: 100 mmol/L (ref 98–110)
Creat: 0.95 mg/dL (ref 0.70–1.25)
GFR, Est African American: 97 mL/min/{1.73_m2} (ref >=60)
GFR, Est Non African American: 84 mL/min/{1.73_m2} (ref >=60)
Globulin: 2.9 g/dL (calc) (ref 1.9–3.7)
Glucose, Bld: 84 mg/dL (ref 65–99)
Potassium: 4.4 mmol/L (ref 3.5–5.3)
Sodium: 138 mmol/L (ref 135–146)
Total Bilirubin: 0.8 mg/dL (ref 0.2–1.2)
Total Protein: 7.6 g/dL (ref 6.1–8.1)

## 2019-06-13 LAB — VITAMIN D 25 HYDROXY (VIT D DEFICIENCY, FRACTURES): Vit D, 25-Hydroxy: 36 ng/mL (ref 30–100)

## 2019-07-06 ENCOUNTER — Telehealth (INDEPENDENT_AMBULATORY_CARE_PROVIDER_SITE_OTHER): Payer: Self-pay

## 2019-07-06 ENCOUNTER — Other Ambulatory Visit (INDEPENDENT_AMBULATORY_CARE_PROVIDER_SITE_OTHER): Payer: Self-pay | Admitting: Internal Medicine

## 2019-07-06 DIAGNOSIS — R32 Unspecified urinary incontinence: Secondary | ICD-10-CM

## 2019-07-06 NOTE — Telephone Encounter (Signed)
Rx has been faxed to [facility]  

## 2019-07-06 NOTE — Telephone Encounter (Signed)
Warren Patrick is asking if a Rx for bed pads can be sent to [facility] he is having urine incontinence at night and his insurance will pay with a Rx please advise?

## 2019-07-27 DIAGNOSIS — N319 Neuromuscular dysfunction of bladder, unspecified: Secondary | ICD-10-CM | POA: Diagnosis not present

## 2019-08-23 DIAGNOSIS — N319 Neuromuscular dysfunction of bladder, unspecified: Secondary | ICD-10-CM | POA: Diagnosis not present

## 2019-09-05 ENCOUNTER — Encounter (INDEPENDENT_AMBULATORY_CARE_PROVIDER_SITE_OTHER): Payer: Self-pay | Admitting: Nurse Practitioner

## 2019-09-05 NOTE — Telephone Encounter (Signed)
This encounter was created in error - please disregard.

## 2019-09-13 ENCOUNTER — Other Ambulatory Visit (INDEPENDENT_AMBULATORY_CARE_PROVIDER_SITE_OTHER): Payer: Self-pay | Admitting: Internal Medicine

## 2019-09-20 ENCOUNTER — Ambulatory Visit (INDEPENDENT_AMBULATORY_CARE_PROVIDER_SITE_OTHER): Payer: Medicare HMO | Admitting: Nurse Practitioner

## 2019-09-27 ENCOUNTER — Other Ambulatory Visit (INDEPENDENT_AMBULATORY_CARE_PROVIDER_SITE_OTHER): Payer: Self-pay

## 2019-09-27 ENCOUNTER — Telehealth (INDEPENDENT_AMBULATORY_CARE_PROVIDER_SITE_OTHER): Payer: Self-pay

## 2019-09-27 DIAGNOSIS — I10 Essential (primary) hypertension: Secondary | ICD-10-CM

## 2019-09-27 MED ORDER — AMLODIPINE BESYLATE 10 MG PO TABS: 10.0000 mg | ORAL_TABLET | Freq: Every day | ORAL | 0 refills | Status: DC

## 2019-09-27 NOTE — Telephone Encounter (Signed)
LeighAnn Jametta Moorehead, CMA  

## 2019-10-03 ENCOUNTER — Ambulatory Visit (INDEPENDENT_AMBULATORY_CARE_PROVIDER_SITE_OTHER): Payer: Medicare Other | Admitting: Nurse Practitioner

## 2019-10-17 ENCOUNTER — Ambulatory Visit (INDEPENDENT_AMBULATORY_CARE_PROVIDER_SITE_OTHER): Payer: Medicare Other | Admitting: Nurse Practitioner

## 2019-11-16 ENCOUNTER — Telehealth (INDEPENDENT_AMBULATORY_CARE_PROVIDER_SITE_OTHER): Payer: Medicare Other | Admitting: Podiatry

## 2019-11-16 DIAGNOSIS — N319 Neuromuscular dysfunction of bladder, unspecified: Secondary | ICD-10-CM | POA: Diagnosis not present

## 2019-11-16 NOTE — Telephone Encounter (Signed)
Patient called. Lives in [place]. Wants to schedule an appointment for his elongated toenails. Referred by PCP.

## 2019-11-28 NOTE — Telephone Encounter (Signed)
Called and left voicemail for pt to call and schedule an appointment with our office. Asked pt to call me directly so that I can get him scheduled with our office.

## 2019-11-29 ENCOUNTER — Encounter (INDEPENDENT_AMBULATORY_CARE_PROVIDER_SITE_OTHER): Payer: Self-pay | Admitting: Nurse Practitioner

## 2019-11-29 ENCOUNTER — Ambulatory Visit (INDEPENDENT_AMBULATORY_CARE_PROVIDER_SITE_OTHER): Payer: Medicare Other | Admitting: Nurse Practitioner

## 2019-11-29 ENCOUNTER — Other Ambulatory Visit: Payer: Self-pay

## 2019-11-29 VITALS — BP 176/100 | HR 67 | Temp 97.3°F | Resp 18 | Ht 69.0 in | Wt 141.2 lb

## 2019-11-29 DIAGNOSIS — L989 Disorder of the skin and subcutaneous tissue, unspecified: Secondary | ICD-10-CM

## 2019-11-29 DIAGNOSIS — E559 Vitamin D deficiency, unspecified: Secondary | ICD-10-CM | POA: Diagnosis not present

## 2019-11-29 DIAGNOSIS — R220 Localized swelling, mass and lump, head: Secondary | ICD-10-CM

## 2019-11-29 DIAGNOSIS — G8929 Other chronic pain: Secondary | ICD-10-CM | POA: Diagnosis not present

## 2019-11-29 DIAGNOSIS — M5442 Lumbago with sciatica, left side: Secondary | ICD-10-CM | POA: Diagnosis not present

## 2019-11-29 DIAGNOSIS — M5441 Lumbago with sciatica, right side: Secondary | ICD-10-CM

## 2019-11-29 DIAGNOSIS — I10 Essential (primary) hypertension: Secondary | ICD-10-CM

## 2019-11-29 MED ORDER — AMLODIPINE BESYLATE 10 MG PO TABS: 10.0000 mg | ORAL_TABLET | Freq: Every day | ORAL | 1 refills | Status: DC

## 2019-11-29 MED ORDER — CYCLOBENZAPRINE HCL 10 MG PO TABS: 10.0000 mg | ORAL_TABLET | Freq: Three times a day (TID) | ORAL | 1 refills | Status: DC | PRN

## 2019-11-29 NOTE — Progress Notes (Signed)
Subjective:  Patient ID: Warren Patrick, male    DOB: 12/03/1955  Age: 64 y.o. MRN: [id number]  CC:  Chief Complaint  Patient presents with  . Follow-up  . Facial Swelling  . Back Pain  . Hypertension  . Other    Vitamin D deficiency, skin lesion      HPI  This patient arrives today for the above  Facial swelling: He arrives today with complaints of intermittent swelling.  He tells me he has been having this swelling on and off about 3 years.  It will affect his eyelids, jaw, lips, groin, and tongue.  He does not know of any triggers, it will also spontaneously resolve.  He tells me the swelling episodes will last anywhere from 24 to 30 hours.  In the past when he was incarcerated he was given a Benadryl injection which resulted in improvement in the swelling.  He does have an allergy to bees, but is not aware of any other allergies.  He has not noticed any pain or itching associated with the swelling.  He does not notice any aggravating or alleviating factors.  He denies ever experiencing any swelling in his throat resulting in difficulty breathing.  Back pain: He also is complaining of low back pain.  He tells me approximately 4 years ago he had an injury and since then he has been getting progressively worsening low back pain.  He tells me the pain is usually 8/10 and is constant.  He tells me standing will sometimes make it better but sitting and laying down makes it worse.  He denies any bowel or bladder incontinence, weakness to his legs, but does say he will sometimes experience pain radiating down his legs and numbness or tingling to his legs.  He has tried ibuprofen and Tylenol in the past has not had much improvement to his pain.  He has taken a couple doses of his significant other's opioid prescription with relief from his pain.  Hypertension: He continues on amlodipine 10 mg daily.  He tells me has been out of this for a couple of months would like a refill.  He brings his  at home blood pressure cuff to be calibrated today.  Vitamin D deficiency: He continues on vitamin D3 supplement last serum level was collected approximately 6 months ago and was 36.  Skin lesion: He has a lesion to his right arm that has been present for about 3 years.  He tells me size has been fairly stable but it is elevated, dark, and scaly.  He is concerned about the lesion as he has a family history of skin cancer.  Past Medical History:  Diagnosis Date  . Drug-seeking behavior   . ETOH abuse   . Hematuria, microscopic   . HTN (hypertension)   . PTSD (post-traumatic stress disorder)   . Vitamin D deficiency       Family History  Problem Relation Age of Onset  . Cancer Mother        breast cancer  . Cancer Father        lung cancer  . Hypertension Father   . Cancer Brother        pancreatic cancer  . Diabetes Brother   . 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 Lives with current fiance,  have been in a relationship for 7 years.   Social History   Tobacco Use  . Smoking status: Current Every Day Smoker    Packs/day: 0.50    Types: Cigarettes  . Smokeless tobacco: Never Used  . Tobacco comment: 1/2PPD  Substance Use Topics  . Alcohol use: Yes    Alcohol/week: 28.0 standard drinks    Types: 28 Standard drinks or equivalent per week    Comment: Mixed drinks daily.     Current Meds  Medication Sig  . amLODipine (NORVASC) 10 MG tablet Take 1 tablet (10 mg total) by mouth daily.  . Cholecalciferol 1.25 MG (50000 UT) TABS Take 2 tablets by mouth daily.  . Melatonin 1 MG CAPS Take 1 capsule (1 mg total) by mouth at bedtime.  . [DISCONTINUED] amLODipine (NORVASC) 10 MG tablet Take 1 tablet (10 mg total) by mouth daily.    ROS:  Review of Systems  Constitutional: Negative for fever.  Eyes: Positive for blurred  vision (for over 5 years).  Respiratory: Negative for shortness of breath.   Cardiovascular: Negative for chest pain.  Musculoskeletal: Positive for joint pain.  Skin: Positive for rash. Negative for itching.     Objective:   Today's Vitals: BP (!) 176/100   Pulse 67   Temp (!) 97.3 F (36.3 C) (Temporal)   Resp 18   Ht '5\' 9"'  (1.753 m)   Wt 141 lb 3.2 oz (64 kg)   SpO2 96%   BMI 20.85 kg/m  Vitals with BMI 11/29/2019 11/29/2019 06/12/2019  Height - '5\' 9"'  '5\' 9"'   Weight - 141 lbs 3 oz 142 lbs 13 oz  BMI - 52.84 13.24  Systolic 401 027 253  Diastolic 664 70 86  Pulse - 67 88     Physical Exam Vitals reviewed.  Constitutional:      Appearance: Normal appearance.  HENT:     Head: Normocephalic and atraumatic.     Jaw: Swelling (mostly to right side) present. No tenderness or pain on movement.     Mouth/Throat:     Lips: No lesions.     Mouth: Mucous membranes are moist.     Dentition: Abnormal dentition. Dental caries present. No dental tenderness.     Tongue: No lesions.     Pharynx: No pharyngeal swelling or posterior oropharyngeal erythema.  Cardiovascular:     Rate and Rhythm: Normal rate and regular rhythm.  Pulmonary:     Effort: Pulmonary effort is normal.     Breath sounds: Normal breath sounds.  Musculoskeletal:     Cervical back: Neck supple.     Lumbar back: Tenderness present. No swelling, edema or deformity. Positive right straight leg raise test and positive left straight leg raise test.  Skin:    General: Skin is warm and dry.  Neurological:     Mental Status: He is alert and oriented to person, place, and time.     Sensory: Sensation is intact.     Motor: Motor function is intact.     Coordination: Coordination is intact.     Gait: Gait is intact.     Deep Tendon Reflexes:     Reflex Scores:      Patellar reflexes are 2+ on the right side and 2+ on the left side. Psychiatric:        Mood and Affect: Mood normal.        Behavior: Behavior normal.         Thought Content: Thought content normal.  Judgment: Judgment normal.          Assessment and Plan   1. Chronic bilateral low back pain with bilateral sciatica   2. Skin lesion   3. Facial swelling   4. Vitamin D deficiency   5. Essential hypertension      Plan: 1.  Patient declined physical therapy as well as undergoing x-ray imaging.  I did let him know that most insurance companies will not pay for CT scan unless patient has failed conservative treatment which would include physical therapy.  He tells me he understands.  Will prescribe as needed muscle relaxer and I encouraged him to take ibuprofen and Tylenol as needed for pain management.  He knows that he should not be taking his significant others pain medication.  He was also told not to drive or operate any heavy machinery when taking the muscle relaxers. 2.  We will refer to dermatology for further evaluation and management of possible skin cancer. 3.  Etiology quite unclear at this time.  I will check some basic blood work today for further evaluation.  It does sound like this is an allergic type reaction to something in his environment.  I recommended he follow-up with allergist I will send referral today.  Also recommended he take over-the-counter Benadryl as needed for swelling episodes.  He was told that if he experiences any swelling in his throat and difficulty breathing that he needs to call 911, and tells me he understands.  He does have multiple dental caries towards the molars on the right side, so I am suspicious for possible dental abscess however patient is denying pain.  May consider antibiotic therapy if complete blood count comes back with elevated white blood cells. 4.  We will check serum level today for further evaluation.  5.  Patient's at home cough did measure similar blood pressure when compared to repeat manual blood pressure.  Blood pressure is uncontrolled at this time, however patient has  been without his medications.  Refill of his amlodipine sent to his pharmacy.   Tests ordered Orders Placed This Encounter  Procedures  . CBC with Differential/Platelets  . CMP with eGFR([facility])  . Vitamin D, 25-hydroxy  . Ambulatory referral to Allergy  . Ambulatory referral to Dermatology      Meds ordered this encounter  Medications  . cyclobenzaprine (FLEXERIL) 10 MG tablet    Sig: Take 1 tablet (10 mg total) by mouth 3 (three) times daily as needed for muscle spasms.    Dispense:  21 tablet    Refill:  1    Order Specific Question:   Supervising Provider    Answer:   Anastasio Champion, NIMISH C [[id number]]  . amLODipine (NORVASC) 10 MG tablet    Sig: Take 1 tablet (10 mg total) by mouth daily.    Dispense:  90 tablet    Refill:  1    Order Specific Question:   Supervising Provider    Answer:   Doree Albee [[id number]]    Patient to follow-up in 1 month or sooner as needed.  Ailene Ards, NP

## 2019-11-30 ENCOUNTER — Encounter (INDEPENDENT_AMBULATORY_CARE_PROVIDER_SITE_OTHER): Payer: Self-pay

## 2019-11-30 LAB — COMPLETE METABOLIC PANEL WITH GFR
AG Ratio: 1.7 (calc) (ref 1.0–2.5)
ALT: 21 U/L (ref 9–46)
AST: 34 U/L (ref 10–35)
Albumin: 4.5 g/dL (ref 3.6–5.1)
Alkaline phosphatase (APISO): 51 U/L (ref 35–144)
BUN/Creatinine Ratio: 6 (calc) (ref 6–22)
BUN: 6 mg/dL — ABNORMAL LOW (ref 7–25)
CO2: 30 mmol/L (ref 20–32)
Calcium: 9.5 mg/dL (ref 8.6–10.3)
Chloride: 102 mmol/L (ref 98–110)
Creat: 0.99 mg/dL (ref 0.70–1.25)
GFR, Est African American: 92 mL/min/{1.73_m2} (ref >=60)
GFR, Est Non African American: 80 mL/min/{1.73_m2} (ref >=60)
Globulin: 2.6 g/dL (calc) (ref 1.9–3.7)
Glucose, Bld: 68 mg/dL (ref 65–99)
Potassium: 3.9 mmol/L (ref 3.5–5.3)
Sodium: 140 mmol/L (ref 135–146)
Total Bilirubin: 1 mg/dL (ref 0.2–1.2)
Total Protein: 7.1 g/dL (ref 6.1–8.1)

## 2019-11-30 LAB — CBC WITH DIFFERENTIAL/PLATELET
Absolute Monocytes: 632 cells/uL (ref 200–950)
Basophils Absolute: 28 cells/uL (ref 0–200)
Basophils Relative: 0.4 %
Eosinophils Absolute: 753 cells/uL — ABNORMAL HIGH (ref 15–500)
Eosinophils Relative: 10.6 %
HCT: 51.5 % — ABNORMAL HIGH (ref 38.5–50.0)
Hemoglobin: 17.6 g/dL — ABNORMAL HIGH (ref 13.2–17.1)
Lymphs Abs: 1321 cells/uL (ref 850–3900)
MCH: 34 pg — ABNORMAL HIGH (ref 27.0–33.0)
MCHC: 34.2 g/dL (ref 32.0–36.0)
MCV: 99.4 fL (ref 80.0–100.0)
MPV: 10.9 fL (ref 7.5–12.5)
Monocytes Relative: 8.9 %
Neutro Abs: 4367 cells/uL (ref 1500–7800)
Neutrophils Relative %: 61.5 %
Platelets: 210 10*3/uL (ref 140–400)
RBC: 5.18 10*6/uL (ref 4.20–5.80)
RDW: 12.9 % (ref 11.0–15.0)
Total Lymphocyte: 18.6 %
WBC: 7.1 10*3/uL (ref 3.8–10.8)

## 2019-11-30 LAB — VITAMIN D 25 HYDROXY (VIT D DEFICIENCY, FRACTURES): Vit D, 25-Hydroxy: 127 ng/mL — ABNORMAL HIGH (ref 30–100)

## 2019-11-30 NOTE — Progress Notes (Signed)
Pt phone number is out of order. Can we turn this into a mychart message?

## 2019-11-30 NOTE — Progress Notes (Signed)
Sending via [employer]. Pt phone number is not working at this time.

## 2019-12-28 ENCOUNTER — Ambulatory Visit (INDEPENDENT_AMBULATORY_CARE_PROVIDER_SITE_OTHER): Payer: Medicare Other | Admitting: Nurse Practitioner

## 2019-12-28 ENCOUNTER — Other Ambulatory Visit: Payer: Self-pay

## 2019-12-28 ENCOUNTER — Encounter (INDEPENDENT_AMBULATORY_CARE_PROVIDER_SITE_OTHER): Payer: Self-pay | Admitting: Nurse Practitioner

## 2019-12-28 VITALS — BP 128/68 | HR 93 | Temp 98.2°F | Ht 69.0 in | Wt 138.6 lb

## 2019-12-28 DIAGNOSIS — N4830 Priapism, unspecified: Secondary | ICD-10-CM

## 2019-12-28 DIAGNOSIS — D582 Other hemoglobinopathies: Secondary | ICD-10-CM

## 2019-12-28 DIAGNOSIS — E785 Hyperlipidemia, unspecified: Secondary | ICD-10-CM | POA: Diagnosis not present

## 2019-12-28 DIAGNOSIS — E559 Vitamin D deficiency, unspecified: Secondary | ICD-10-CM

## 2019-12-28 DIAGNOSIS — Z23 Encounter for immunization: Secondary | ICD-10-CM | POA: Diagnosis not present

## 2019-12-28 DIAGNOSIS — I10 Essential (primary) hypertension: Secondary | ICD-10-CM

## 2019-12-28 NOTE — Patient Instructions (Signed)

## 2019-12-28 NOTE — Progress Notes (Signed)
Subjective:  Patient ID: Warren Patrick, male    DOB: September 01, 1955  Age: 64 y.o. MRN: [id number]  CC:  Chief Complaint  Patient presents with  . Follow-up    Doing well  . Hypertension  . Hyperlipidemia  . Other    Elevated hemoglobin, concerns with erections, vitamin D deficiency      HPI  This patient arrives today for the above.  Hypertension: He continues on amlodipine 10 mg daily and is tolerating this well.  Hyperlipidemia: Last lipid panel showed LDL of 79 this was collected earlier this year.  He is not any medication to control his cholesterol at this time.  Elevated hemoglobin: Hemoglobin was a bit elevated when last CBC was collected.  He is a current smoker.  He is not having any chest pain, swelling, or shortness of breath.  He does experience intermittent lower extremity cramping.  Last metabolic panel showed normal electrolytes.  Vitamin D deficiency: He continues on 10,000 IUs of vitamin D3 daily.  Last serum check was done about 1 month ago and level was 127.  Concerns with erections: He tells me he has multiple erections and is concerned that he may have elevated testosterone.  He is wondering if we can check his testosterone levels.  Of note, he was having intermittent facial swelling at last office visit.  That has been going on for approximately 3 years intermittently.  He was referred to allergist and does not appear that he went to this appointment.  He tells me today that he has not yet scheduled the appointment plans on doing this in the near future.  Past Medical History:  Diagnosis Date  . Drug-seeking behavior   . ETOH abuse   . Hematuria, microscopic   . HTN (hypertension)   . PTSD (post-traumatic stress disorder)   . Vitamin D deficiency       Family History  Problem Relation Age of Onset  . Cancer Mother        breast cancer  . Cancer Father        lung cancer  . Hypertension Father   . Cancer Brother        pancreatic cancer  .  Diabetes Brother   . 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 Lives with current fiance, have been in a relationship for 7 years.   Social History   Tobacco Use  . Smoking status: Current Every Day Smoker    Packs/day: 0.50    Types: Cigarettes  . Smokeless tobacco: Never Used  . Tobacco comment: 1/2PPD  Substance Use Topics  . Alcohol use: Yes    Alcohol/week: 28.0 standard drinks    Types: 28 Standard drinks or equivalent per week    Comment: Mixed drinks daily.     Current Meds  Medication Sig  . amLODipine (NORVASC) 10 MG tablet Take 1 tablet (10 mg total) by mouth daily.  . Cholecalciferol 1.25 MG (50000 UT) TABS Take 1 tablet by mouth daily.  . cyclobenzaprine (FLEXERIL) 10 MG tablet Take 1 tablet (10 mg total) by mouth 3 (three) times daily as needed for muscle spasms.    ROS:  Review of Systems  Constitutional: Negative.   Eyes: Negative.   Respiratory: Negative.   Cardiovascular: Negative.   Neurological: Negative.  Objective:   Today's Vitals: BP 128/68   Pulse 93   Temp 98.2 F (36.8 C) (Temporal)   Ht '5\' 9"'  (1.753 m)   Wt 138 lb 9.6 oz (62.9 kg)   SpO2 96%   BMI 20.47 kg/m  Vitals with BMI 12/28/2019 11/29/2019 11/29/2019  Height '5\' 9"'  - '5\' 9"'   Weight 138 lbs 10 oz - 141 lbs 3 oz  BMI 50.93 - 26.71  Systolic 245 809 983  Diastolic 68 382 70  Pulse 93 - 67     Physical Exam Vitals reviewed.  Constitutional:      Appearance: Normal appearance.  HENT:     Head: Normocephalic and atraumatic.  Cardiovascular:     Rate and Rhythm: Normal rate and regular rhythm.  Pulmonary:     Effort: Pulmonary effort is normal.     Breath sounds: Normal breath sounds.  Musculoskeletal:     Cervical back: Neck supple.  Skin:    General: Skin is warm and dry.  Neurological:     Mental Status: He is alert  and oriented to person, place, and time.  Psychiatric:        Mood and Affect: Mood normal.        Behavior: Behavior normal.        Thought Content: Thought content normal.        Judgment: Judgment normal.          Assessment and Plan   1. Vitamin D deficiency   2. Elevated hemoglobin (HCC)   3. Needs flu shot   4. Prolonged erection   5. Hyperlipidemia, unspecified hyperlipidemia type   6. Primary hypertension      Plan: 1.  Recommend that he reduce his intake to 5000 IUs of vitamin D3 daily.  Tells me he will do this.  We will check blood work in 1 month at which point we will recheck serum vitamin D level. 2.  We will recheck CBC with differential when he returns in 1 month for blood work.  Consider referral to hematology if hemoglobin remains elevated. 3.  We will administer flu shot today. 4.  We will check testosterone levels per his request at next appointment. 5.  Continue lifestyle modification to help control cholesterol. 6.  He will continue on his amlodipine as currently prescribed.   Tests ordered Orders Placed This Encounter  Procedures  . Flu Vaccine QUAD 6+ mos PF IM (Fluarix Quad PF)  . CMP with eGFR([facility])  . CBC with Differential/Platelets  . Vitamin D, 25-hydroxy  . Testosterone Total,Free,Bio, Males      No orders of the defined types were placed in this encounter.   Patient to follow-up in 1 month for repeat blood work and then again in 3 months for office visit.  Ailene Ards, NP

## 2020-01-15 ENCOUNTER — Ambulatory Visit: Payer: Medicare Other | Admitting: Podiatry

## 2020-01-29 ENCOUNTER — Other Ambulatory Visit (INDEPENDENT_AMBULATORY_CARE_PROVIDER_SITE_OTHER): Payer: Medicare Other

## 2020-01-31 ENCOUNTER — Ambulatory Visit: Payer: Medicare Other | Admitting: Allergy & Immunology

## 2020-02-15 DIAGNOSIS — N319 Neuromuscular dysfunction of bladder, unspecified: Secondary | ICD-10-CM | POA: Diagnosis not present

## 2020-04-04 ENCOUNTER — Ambulatory Visit (INDEPENDENT_AMBULATORY_CARE_PROVIDER_SITE_OTHER): Payer: Medicare Other | Admitting: Internal Medicine

## 2020-04-17 DIAGNOSIS — N319 Neuromuscular dysfunction of bladder, unspecified: Secondary | ICD-10-CM | POA: Diagnosis not present

## 2020-05-02 ENCOUNTER — Ambulatory Visit: Payer: Medicare Other | Admitting: Physician Assistant

## 2020-05-11 IMAGING — DX DG RIBS W/ CHEST 3+V*L*
5 series · 5 of 5 positions shown · non-contrast
Comparison: None.

CLINICAL DATA: Fall with rib pain

EXAM:
LEFT RIBS AND CHEST - 3+ VIEW

[chest pa]
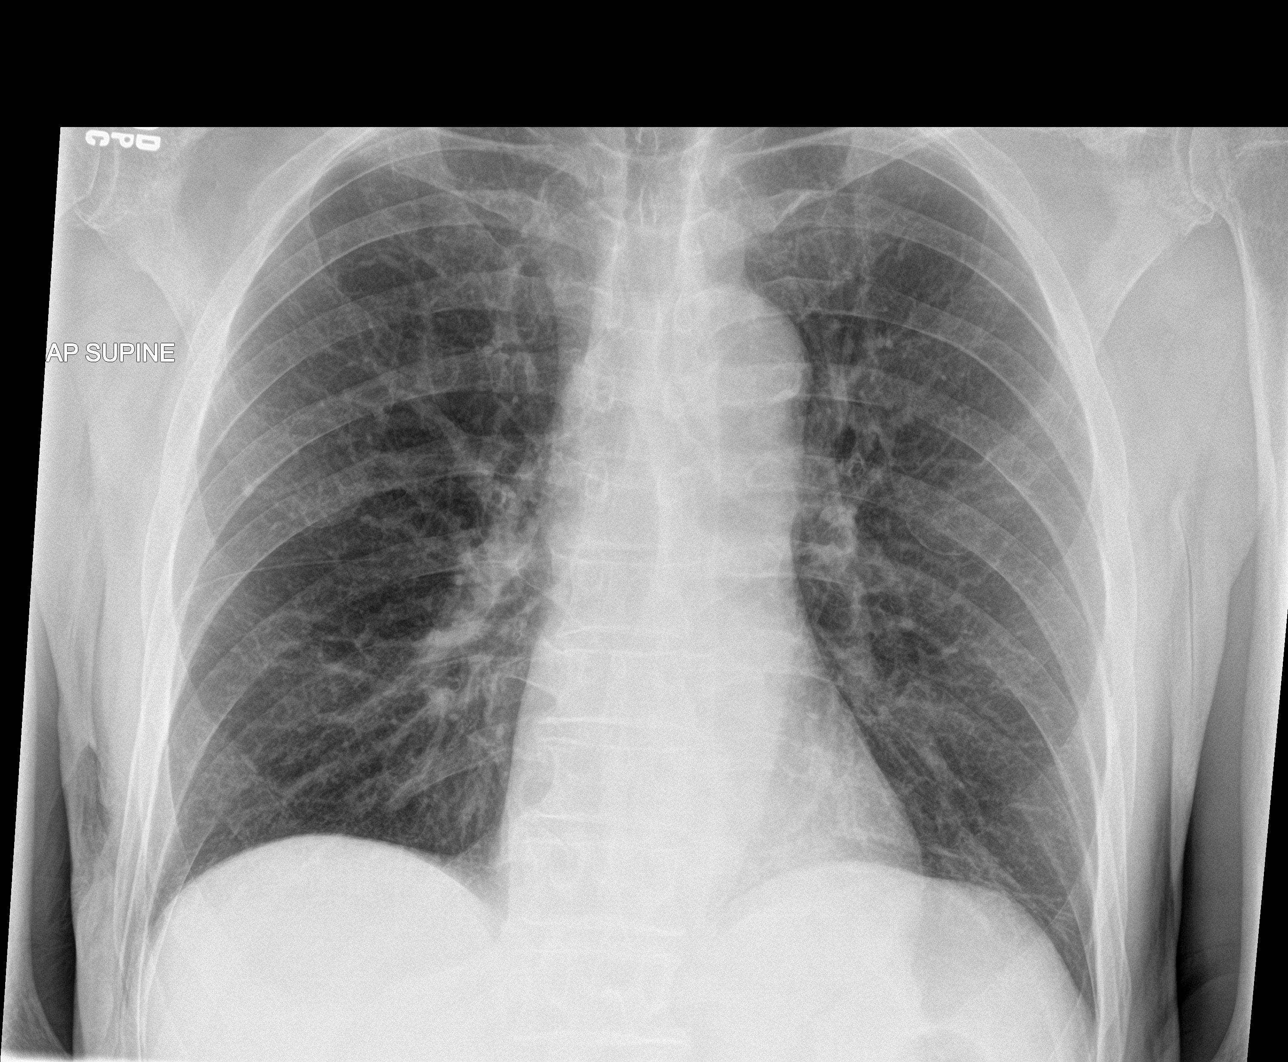

[rib pa obl (1 of 2)]
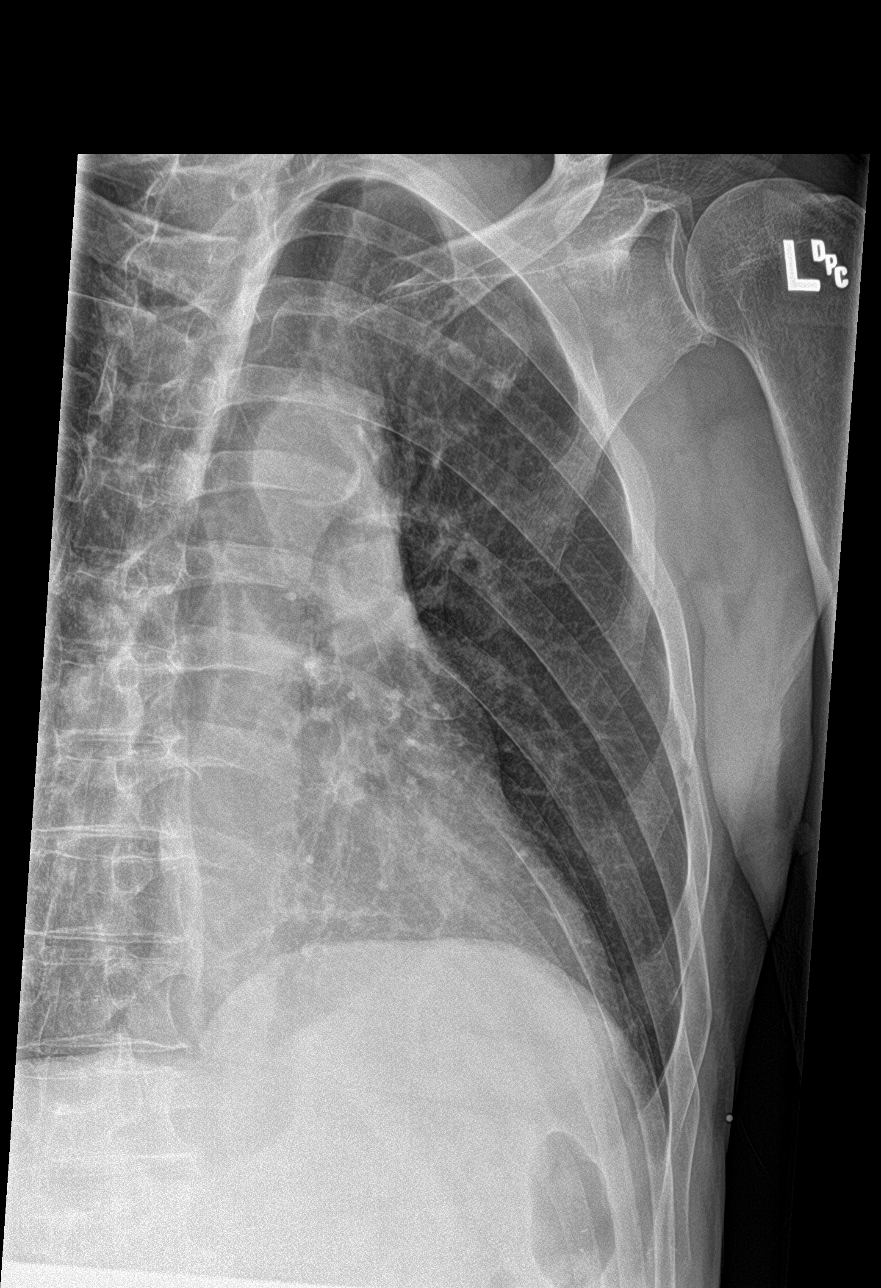

[rib pa obl (2 of 2)]
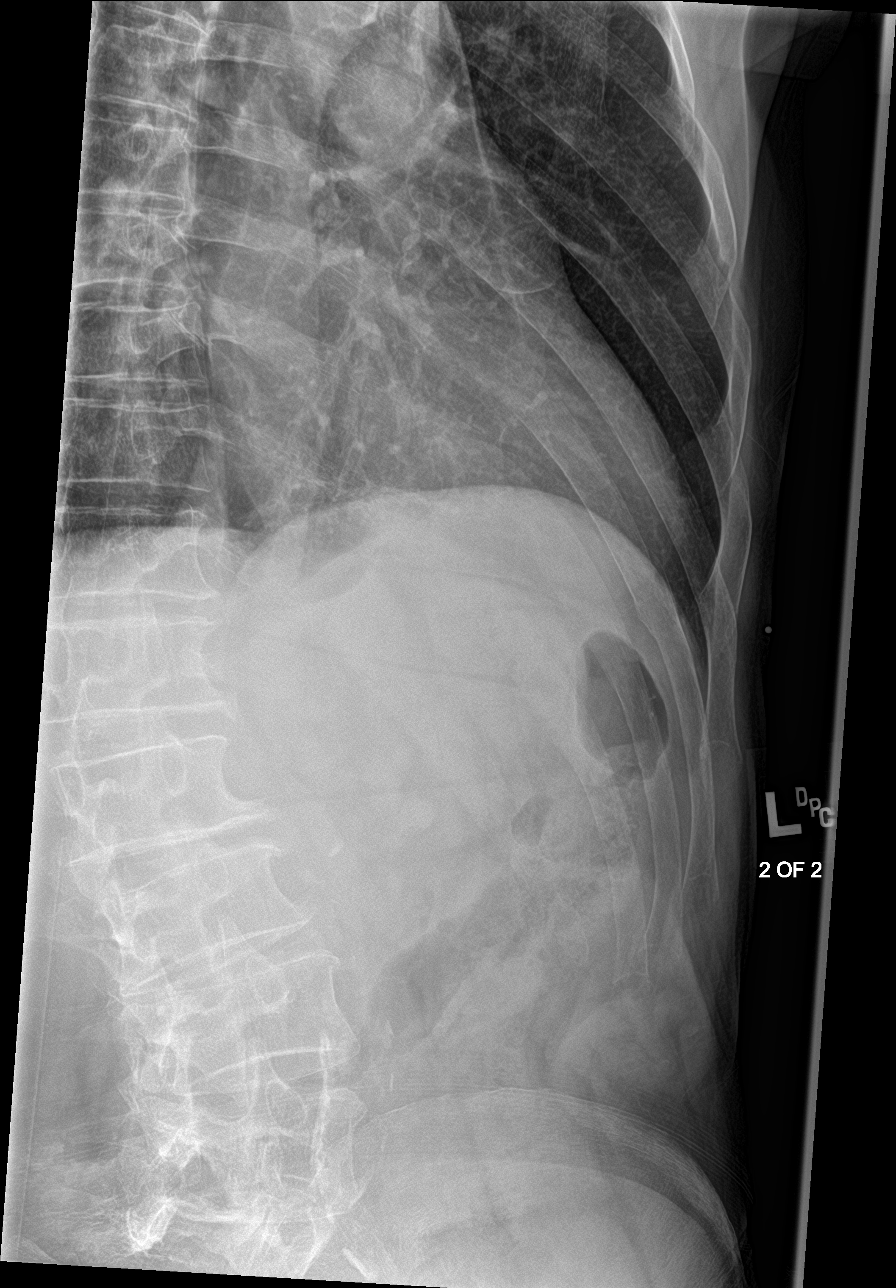

[rib pa (1 of 2)]
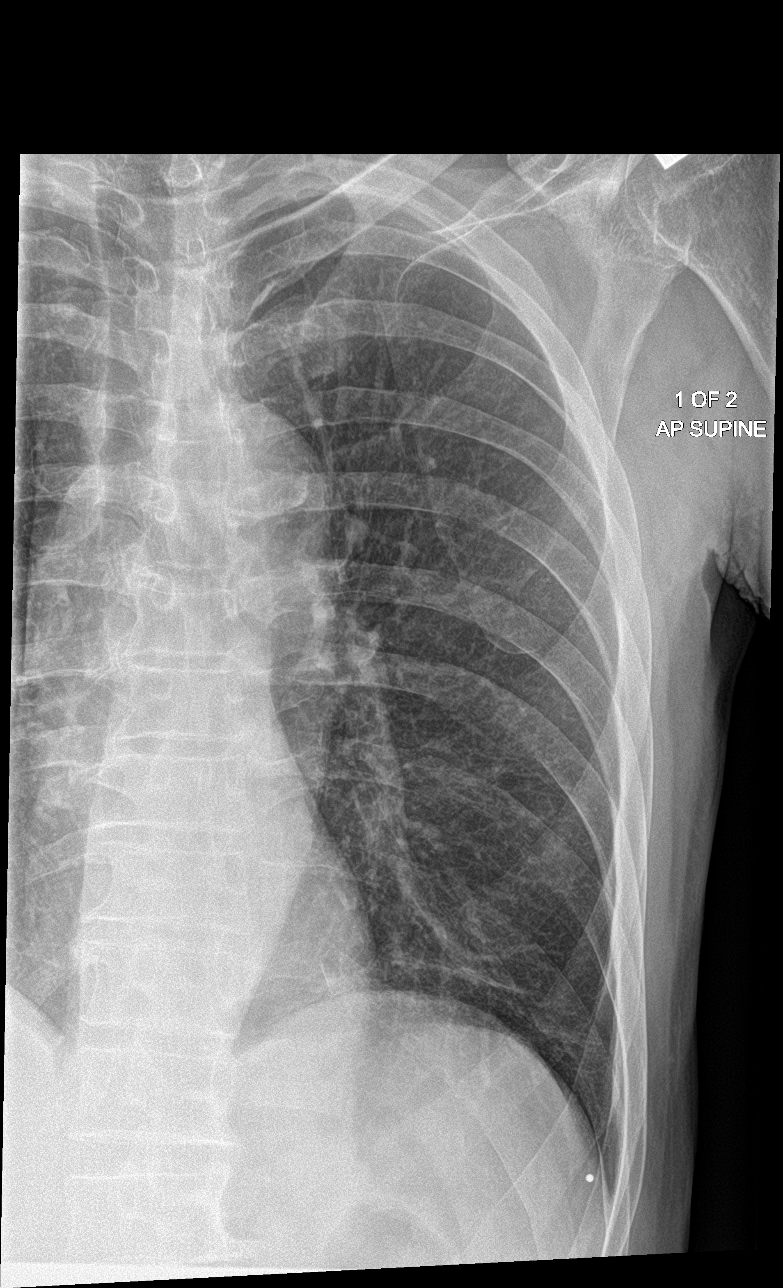

[rib pa (2 of 2)]
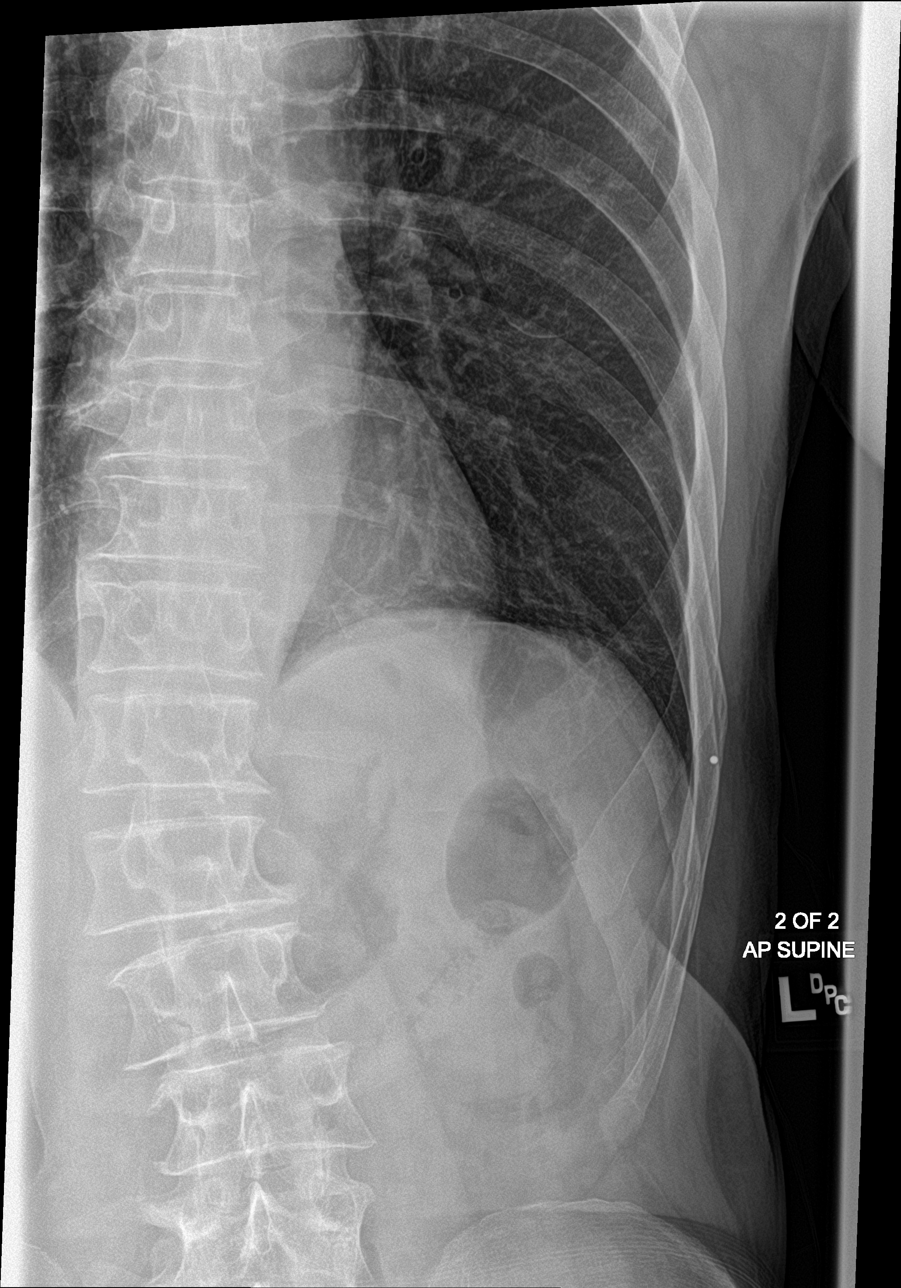

[5 of 5 positions shown; findings below may reference images not displayed]

FINDINGS: Single-view chest demonstrates no acute airspace disease or
effusion. Normal heart size with aortic atherosclerosis. No
pneumothorax.

Left rib series demonstrates acute eighth and ninth rib fractures.
IMPRESSION: 1. Negative for pneumothorax or pleural effusion
2. Suspected acute left eighth and ninth rib fractures.

## 2020-05-21 DIAGNOSIS — N319 Neuromuscular dysfunction of bladder, unspecified: Secondary | ICD-10-CM | POA: Diagnosis not present

## 2020-06-05 ENCOUNTER — Ambulatory Visit (INDEPENDENT_AMBULATORY_CARE_PROVIDER_SITE_OTHER): Payer: Medicare Other | Admitting: Nurse Practitioner

## 2020-06-14 DIAGNOSIS — N319 Neuromuscular dysfunction of bladder, unspecified: Secondary | ICD-10-CM | POA: Diagnosis not present

## 2020-07-16 DIAGNOSIS — N319 Neuromuscular dysfunction of bladder, unspecified: Secondary | ICD-10-CM | POA: Diagnosis not present

## 2020-08-15 DIAGNOSIS — N319 Neuromuscular dysfunction of bladder, unspecified: Secondary | ICD-10-CM | POA: Diagnosis not present

## 2020-09-14 DIAGNOSIS — N319 Neuromuscular dysfunction of bladder, unspecified: Secondary | ICD-10-CM | POA: Diagnosis not present

## 2020-10-03 IMAGING — DX CHEST - 2 VIEW
2 series · 2 of 2 positions shown · non-contrast
Comparison: Plain film of the chest 05/05/2018.

CLINICAL DATA: Chest pain and shortness of breath.

EXAM:
CHEST - 2 VIEW

[chest pa]
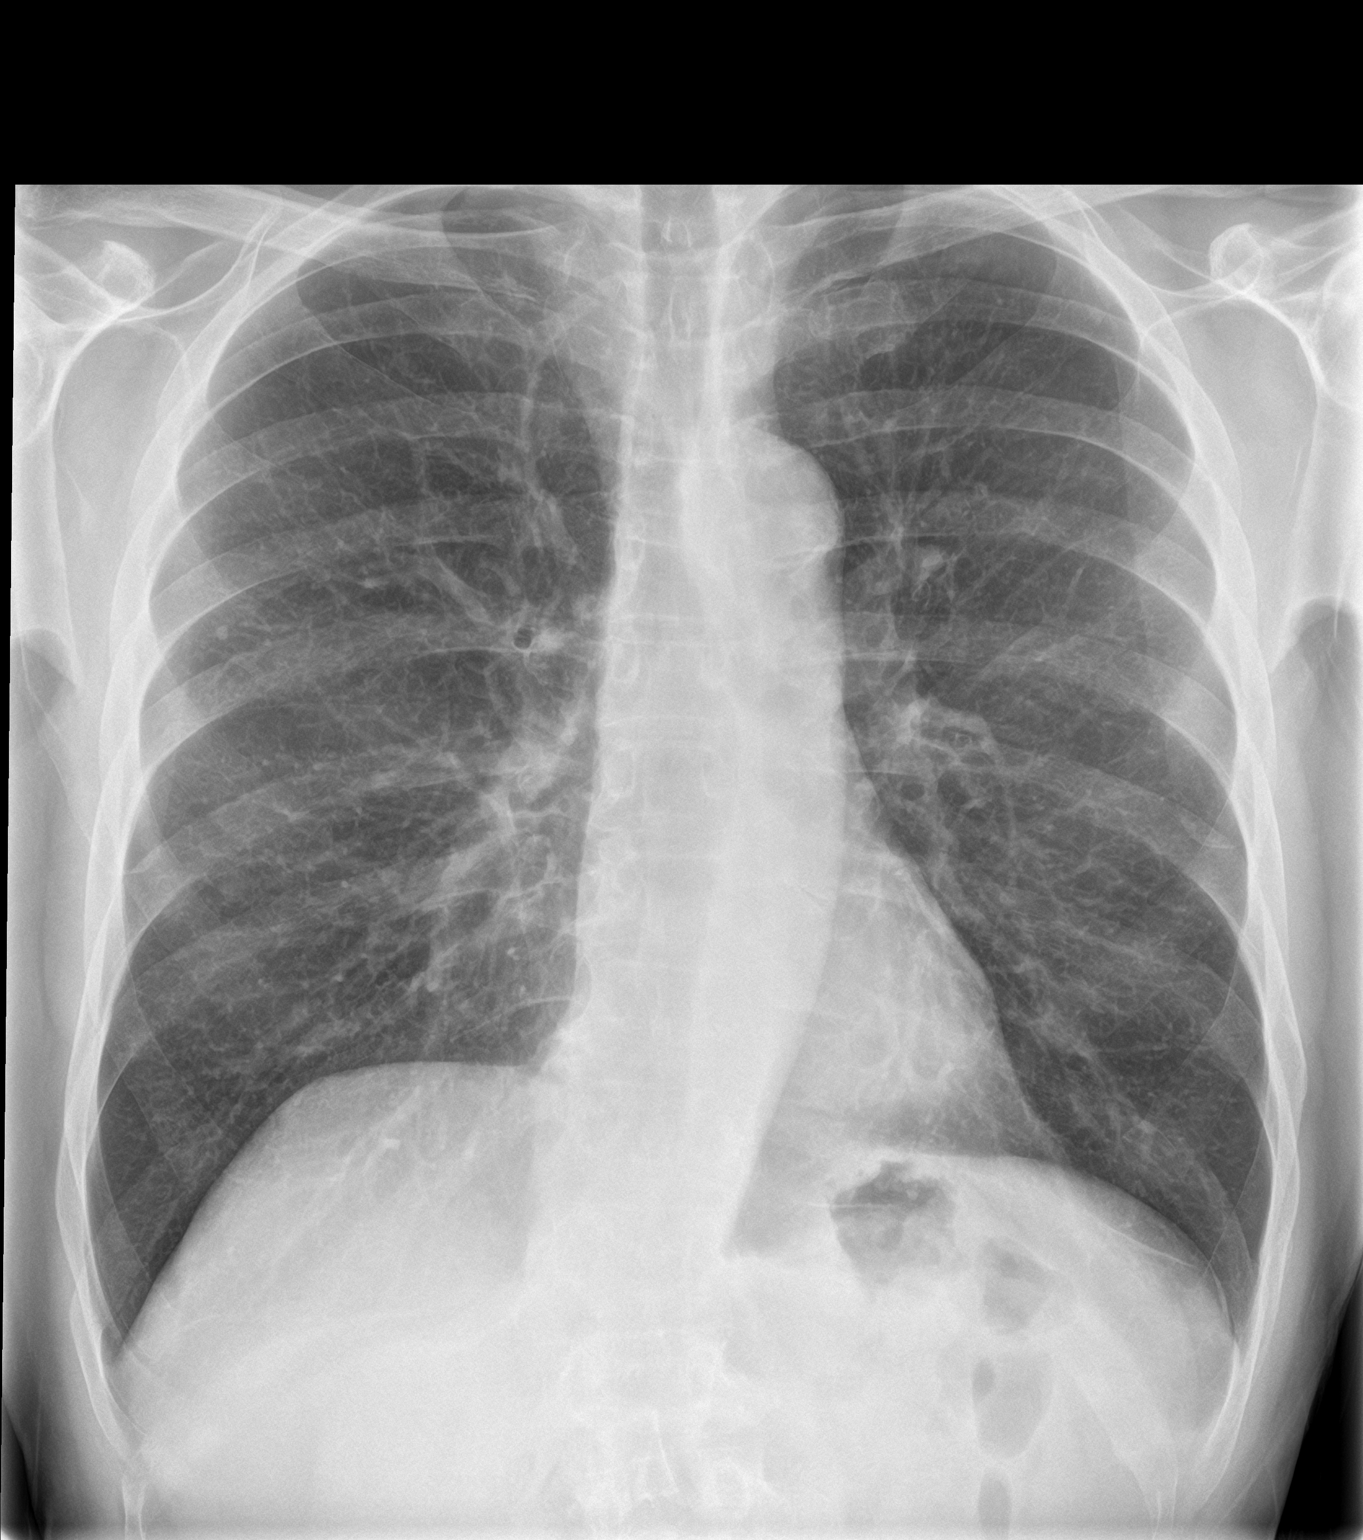

[chest lat]
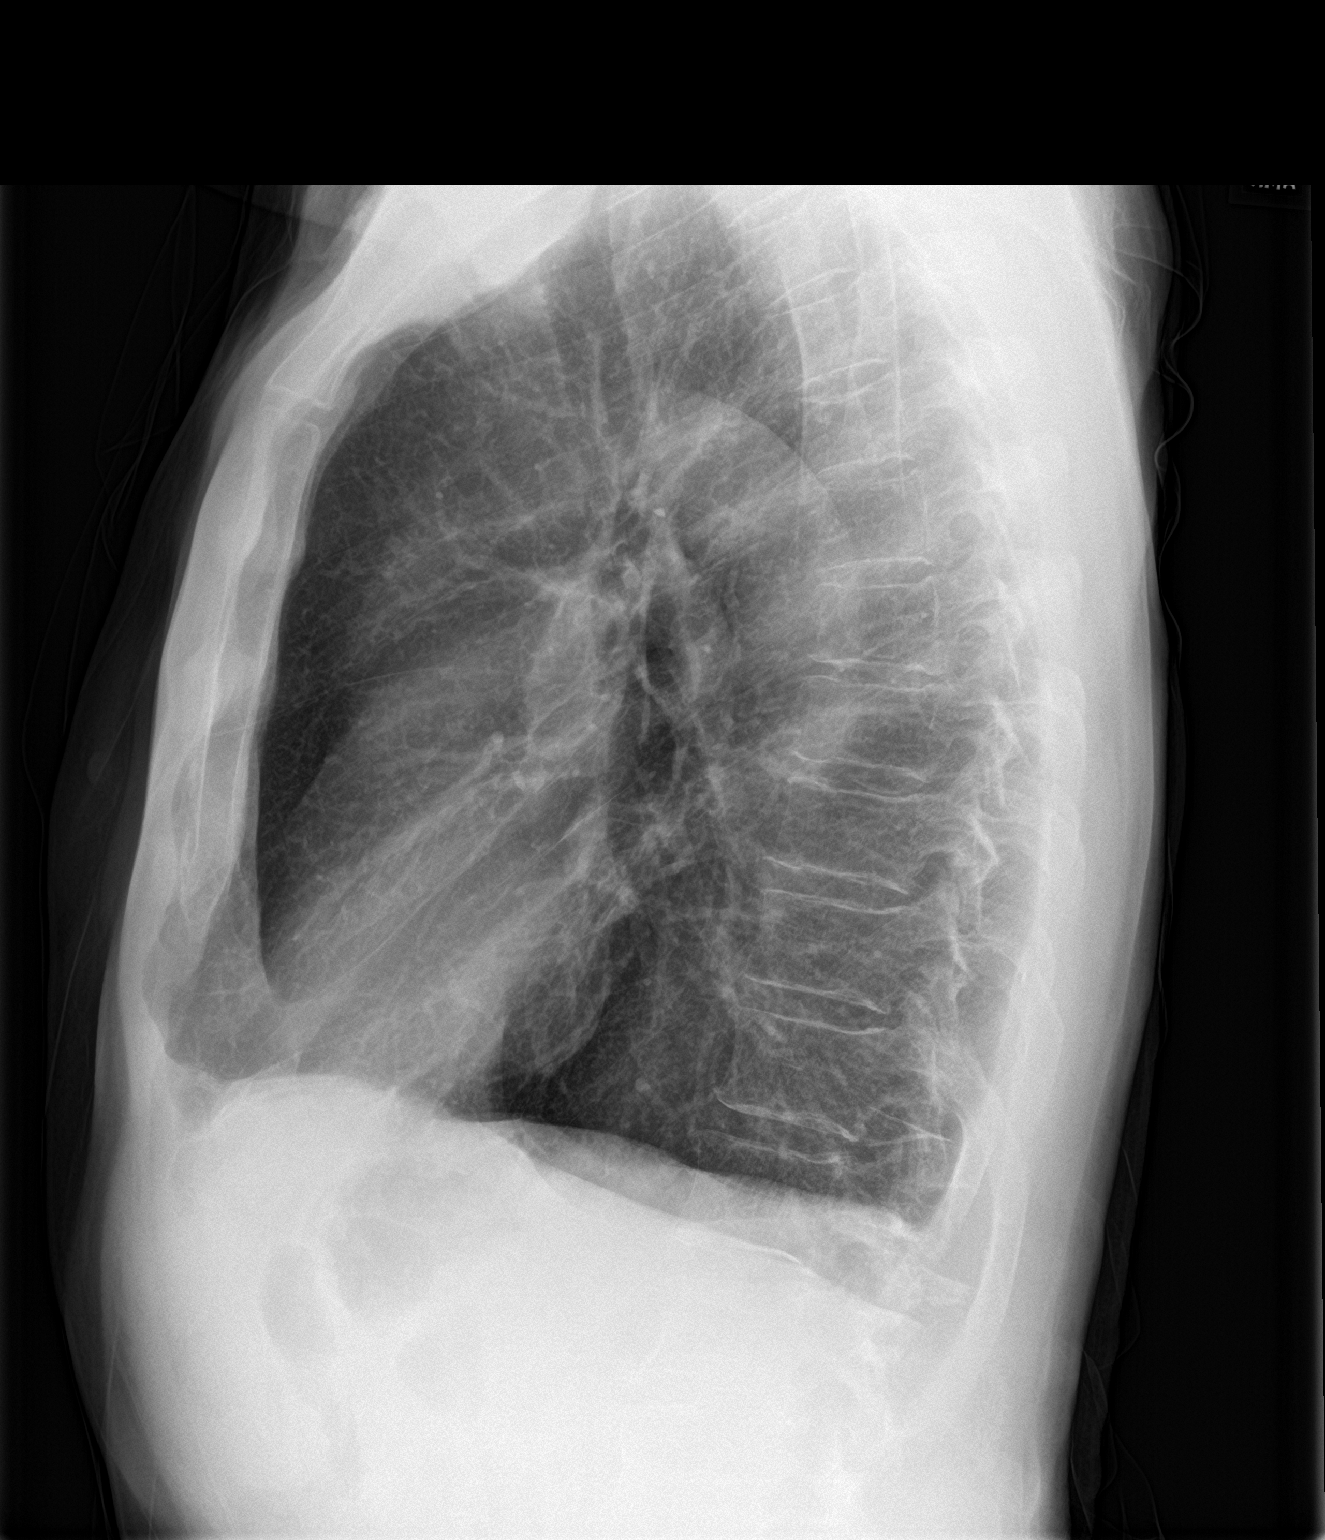

[2 of 2 positions shown; findings below may reference images not displayed]

FINDINGS: Punctate calcified granuloma right upper lobe noted. Lungs otherwise
clear. Chest is hyperexpanded. No pneumothorax or pleural effusion.
Heart size is normal. Aortic atherosclerosis is noted. No acute or
focal bony abnormality.
IMPRESSION: No acute disease.

Emphysema.

Atherosclerosis.

## 2020-10-14 DIAGNOSIS — N319 Neuromuscular dysfunction of bladder, unspecified: Secondary | ICD-10-CM | POA: Diagnosis not present

## 2020-11-11 DIAGNOSIS — T59814A Toxic effect of smoke, undetermined, initial encounter: Secondary | ICD-10-CM | POA: Diagnosis not present

## 2020-11-14 DIAGNOSIS — N319 Neuromuscular dysfunction of bladder, unspecified: Secondary | ICD-10-CM | POA: Diagnosis not present

## 2020-12-16 DIAGNOSIS — N319 Neuromuscular dysfunction of bladder, unspecified: Secondary | ICD-10-CM | POA: Diagnosis not present

## 2021-01-14 DIAGNOSIS — N319 Neuromuscular dysfunction of bladder, unspecified: Secondary | ICD-10-CM | POA: Diagnosis not present

## 2021-02-12 ENCOUNTER — Encounter: Payer: Self-pay | Admitting: Internal Medicine

## 2021-02-12 ENCOUNTER — Ambulatory Visit (INDEPENDENT_AMBULATORY_CARE_PROVIDER_SITE_OTHER): Payer: Medicare Other | Admitting: Internal Medicine

## 2021-02-12 ENCOUNTER — Other Ambulatory Visit: Payer: Self-pay

## 2021-02-12 VITALS — BP 124/86 | HR 73 | Resp 18 | Ht 69.0 in | Wt 139.0 lb

## 2021-02-12 DIAGNOSIS — K219 Gastro-esophageal reflux disease without esophagitis: Secondary | ICD-10-CM

## 2021-02-12 DIAGNOSIS — I10 Essential (primary) hypertension: Secondary | ICD-10-CM | POA: Diagnosis not present

## 2021-02-12 DIAGNOSIS — E782 Mixed hyperlipidemia: Secondary | ICD-10-CM

## 2021-02-12 DIAGNOSIS — G4700 Insomnia, unspecified: Secondary | ICD-10-CM | POA: Diagnosis not present

## 2021-02-12 DIAGNOSIS — Z125 Encounter for screening for malignant neoplasm of prostate: Secondary | ICD-10-CM

## 2021-02-12 DIAGNOSIS — F17210 Nicotine dependence, cigarettes, uncomplicated: Secondary | ICD-10-CM

## 2021-02-12 DIAGNOSIS — Z0000 Encounter for general adult medical examination without abnormal findings: Secondary | ICD-10-CM

## 2021-02-12 DIAGNOSIS — E559 Vitamin D deficiency, unspecified: Secondary | ICD-10-CM

## 2021-02-12 DIAGNOSIS — Z0001 Encounter for general adult medical examination with abnormal findings: Secondary | ICD-10-CM | POA: Diagnosis not present

## 2021-02-12 DIAGNOSIS — N401 Enlarged prostate with lower urinary tract symptoms: Secondary | ICD-10-CM

## 2021-02-12 DIAGNOSIS — R350 Frequency of micturition: Secondary | ICD-10-CM | POA: Diagnosis not present

## 2021-02-12 DIAGNOSIS — B070 Plantar wart: Secondary | ICD-10-CM | POA: Diagnosis not present

## 2021-02-12 DIAGNOSIS — Z720 Tobacco use: Secondary | ICD-10-CM | POA: Diagnosis not present

## 2021-02-12 MED ORDER — TAMSULOSIN HCL 0.4 MG PO CAPS: 0.4000 mg | ORAL_CAPSULE | Freq: Every day | ORAL | 5 refills | Status: DC

## 2021-02-12 MED ORDER — OMEPRAZOLE 20 MG PO CPDR: 20.0000 mg | DELAYED_RELEASE_CAPSULE | Freq: Every day | ORAL | 5 refills | Status: DC

## 2021-02-12 MED ORDER — HYDROXYZINE PAMOATE 25 MG PO CAPS: 25.0000 mg | ORAL_CAPSULE | Freq: Every evening | ORAL | 2 refills | Status: DC | PRN

## 2021-02-12 NOTE — Assessment & Plan Note (Signed)
Advised to use Compound W Refer to podiatry for plantar wart and ingrown toenail

## 2021-02-12 NOTE — Patient Instructions (Addendum)
Please start taking Vistaril for insomnia.  Please take Omeprazole for stomach pain. Avoid hot and spicy food. Please try to cut down smoking and alcohol use.  Please use Compound W for plantar wart. You are being referred to Podiatry.

## 2021-02-12 NOTE — Assessment & Plan Note (Signed)
Chronic urinary frequency and hesitancy Started Flomax Check PSA

## 2021-02-12 NOTE — Assessment & Plan Note (Signed)
Admits eating hot and spicy hotdogs Started Omeprazole Needs to avoid hot and spicy food, and cut down alcohol and tobacco use

## 2021-02-12 NOTE — Assessment & Plan Note (Signed)
BP Readings from Last 1 Encounters:  02/12/21 124/86   Well-controlled without Amlodipine now, discontinued Advised DASH diet and moderate exercise/walking, at least 150 mins/week

## 2021-02-12 NOTE — Progress Notes (Signed)
New Patient Office Visit  Subjective:  Patient ID: Warren Patrick, male    DOB: 01/12/1956  Age: 65 y.o. MRN: [id number]  CC:  Chief Complaint  Patient presents with   New Patient (Initial Visit)    New patient was seeing Anastasio Champion has plantars wart on bottom of right foot also has been having abdominal pain and frequent urination for several months     HPI Traiton Lingg Svetlik is a 65 y.o. male with past medical history of HTN, GERD, insomnia, tobacco and alcohol abuse who presents for establishing care.  HTN: He was on amlodipine in the past for HTN.  He has run out of it and his BP was well controlled in the office today.  He denies any headache, dizziness, chest pain or palpitations.  He complains of chronic abdominal/epigastric pain, unrelated to food intake.  She denies any vomiting, constipation, diarrhea, melena or hematochezia.  Of note, he has significant history of alcohol abuse, but has cut down a lot.  He still binge drinks on some days.  He also reports smoking half pack per day.  He used to smoke 1 pack/day and had started at age 32.  He complains of urinary frequency and hesitance at times, but denies any dysuria or hematuria.  He complains of chronic insomnia, which could be related to alcohol intake as well.  He takes care of his girlfriend, who has quadriplegia.  He complains of a wart on his right foot and ingrown toenail.  He agrees to see podiatry after 2 weeks.  Past Medical History:  Diagnosis Date   Drug-seeking behavior    ETOH abuse    Hematuria, microscopic    HTN (hypertension)    PTSD (post-traumatic stress disorder)    Vitamin D deficiency     Past Surgical History:  Procedure Laterality Date   APPENDECTOMY     ROTATOR CUFF REPAIR     right shoulder, on 10/2013   TONSILLECTOMY      Family History  Problem Relation Age of Onset   Cancer Mother        breast cancer   Cancer Father        lung cancer   Hypertension Father    Cancer Brother         pancreatic cancer   Diabetes Brother    Diabetes Sister     Social History   Socioeconomic History   Marital status: Divorced    Spouse name: Not on file   Number of children: Not on file   Years of education: Not on file   Highest education level: Not on file  Occupational History   Not on file  Tobacco Use   Smoking status: Every Day    Packs/day: 0.50    Types: Cigarettes   Smokeless tobacco: Never   Tobacco comments:    1/2PPD  Substance and Sexual Activity   Alcohol use: Yes    Alcohol/week: 28.0 standard drinks    Types: 28 Standard drinks or equivalent per week    Comment: Mixed drinks daily.   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 Lives with current fiance, have been in a relationship for 7 years.   Social Determinants of Health   Financial Resource Strain:  Not on file  Food Insecurity: Not on file  Transportation Needs: Not on file  Physical Activity: Not on file  Stress: Not on file  Social Connections: Not on file  Intimate Partner Violence: Not on file    ROS Review of Systems  Constitutional:  Negative for chills and fever.  HENT:  Negative for congestion and sore throat.   Eyes:  Negative for pain and discharge.  Respiratory:  Negative for cough and shortness of breath.   Cardiovascular:  Negative for chest pain and palpitations.  Gastrointestinal:  Positive for abdominal pain. Negative for diarrhea, nausea and vomiting.  Endocrine: Negative for polydipsia and polyuria.  Genitourinary:  Positive for frequency. Negative for dysuria and hematuria.  Musculoskeletal:  Negative for neck pain and neck stiffness.  Skin:  Negative for rash.  Neurological:  Negative for dizziness, weakness, numbness and headaches.  Psychiatric/Behavioral:   Positive for sleep disturbance. Negative for agitation and behavioral problems.    Objective:   Today's Vitals: BP 124/86 (BP Location: Left Arm, Patient Position: Sitting, Cuff Size: Normal)   Pulse 73   Resp 18   Ht 5\' 9"  (1.753 m)   Wt 139 lb 0.6 oz (63.1 kg)   SpO2 97%   BMI 20.53 kg/m   Physical Exam Vitals reviewed.  Constitutional:      General: He is not in acute distress.    Appearance: He is not diaphoretic.  HENT:     Head: Normocephalic and atraumatic.     Nose: Nose normal.     Mouth/Throat:     Mouth: Mucous membranes are moist.  Eyes:     General: No scleral icterus.    Extraocular Movements: Extraocular movements intact.  Cardiovascular:     Rate and Rhythm: Normal rate and regular rhythm.     Pulses: Normal pulses.     Heart sounds: Normal heart sounds. No murmur heard. Pulmonary:     Breath sounds: Normal breath sounds. No wheezing or rales.  Abdominal:     Palpations: Abdomen is soft.     Tenderness: There is abdominal tenderness (Epigastric, mild).  Musculoskeletal:     Cervical back: Neck supple. No tenderness.     Right lower leg: No edema.     Left lower leg: No edema.  Feet:     Right foot:     Skin integrity: Callus present.     Toenail Condition: Right toenails are abnormally thick, long and ingrown.     Left foot:     Toenail Condition: Left toenails are abnormally thick, long and ingrown.  Skin:    General: Skin is warm.     Findings: No rash.  Neurological:     General: No focal deficit present.     Mental Status: He is alert and oriented to person, place, and time.     Sensory: No sensory deficit.     Motor: No weakness.  Psychiatric:        Mood and Affect: Mood normal.        Behavior: Behavior normal.    Assessment & Plan:   Problem List Items Addressed This Visit       Cardiovascular and Mediastinum   Essential hypertension - Primary    BP Readings from Last 1 Encounters:  02/12/21 124/86  Well-controlled without  Amlodipine now, discontinued Advised DASH diet and moderate exercise/walking, at least 150 mins/week         Digestive   Gastroesophageal reflux disease    Admits eating  hot and spicy hotdogs Started Omeprazole Needs to avoid hot and spicy food, and cut down alcohol and tobacco use      Relevant Medications   omeprazole (PRILOSEC) 20 MG capsule     Musculoskeletal and Integument   Plantar wart    Advised to use Compound W Refer to podiatry for plantar wart and ingrown toenail        Other   Tobacco abuse    Smokes about 0.5 pack/day, used to smoke 1 pack/day, started at age 28  Asked about quitting: confirms that he currently smokes cigarettes Advise to quit smoking: Educated about QUITTING to reduce the risk of cancer, cardio and cerebrovascular disease. Assess willingness: Unwilling to quit at this time, but is working on cutting back. Assist with counseling and pharmacotherapy: Counseled for 5 minutes and literature provided. Arrange for follow up: follow up in 3 months and continue to offer help.       Insomnia    Could be related to mood disorder and/or alcohol abuse Vistaril as needed for now Sleep hygiene discussed      Relevant Medications   hydrOXYzine (VISTARIL) 25 MG capsule   Hyperlipidemia    Check lipid profile      Relevant Orders   Lipid Profile   Vitamin D deficiency   Relevant Orders   VITAMIN D 25 Hydroxy (Vit-D Deficiency, Fractures)   Prostate cancer screening    Ordered PSA after discussing its limitations for prostate cancer screening, including false positive results leading additional investigations.      Relevant Orders   PSA   Ambulatory referral to Podiatry   Benign prostatic hyperplasia with urinary frequency    Chronic urinary frequency and hesitancy Started Flomax Check PSA      Relevant Medications   tamsulosin (FLOMAX) 0.4 MG CAPS capsule   Other Relevant Orders   Urinalysis   PSA     Outpatient Encounter  Medications as of 02/12/2021  Medication Sig   hydrOXYzine (VISTARIL) 25 MG capsule Take 1 capsule (25 mg total) by mouth at bedtime as needed for anxiety (Insomnia).   omeprazole (PRILOSEC) 20 MG capsule Take 1 capsule (20 mg total) by mouth daily.   tamsulosin (FLOMAX) 0.4 MG CAPS capsule Take 1 capsule (0.4 mg total) by mouth daily.   [DISCONTINUED] amLODipine (NORVASC) 10 MG tablet Take 1 tablet (10 mg total) by mouth daily. (Patient not taking: Reported on 02/12/2021)   [DISCONTINUED] Cholecalciferol 1.25 MG (50000 UT) TABS Take 1 tablet by mouth daily. (Patient not taking: Reported on 02/12/2021)   [DISCONTINUED] cyclobenzaprine (FLEXERIL) 10 MG tablet Take 1 tablet (10 mg total) by mouth 3 (three) times daily as needed for muscle spasms. (Patient not taking: Reported on 02/12/2021)   [DISCONTINUED] Melatonin 1 MG CAPS Take 1 capsule (1 mg total) by mouth at bedtime. (Patient not taking: Reported on 12/28/2019)   No facility-administered encounter medications on file as of 02/12/2021.    Follow-up: Return in about 3 months (around 05/13/2021) for Annual physical.   Anabel Halon, MD

## 2021-02-12 NOTE — Assessment & Plan Note (Signed)
Check lipid profile

## 2021-02-12 NOTE — Assessment & Plan Note (Signed)
Smokes about 0.5 pack/day, used to smoke 1 pack/day, started at age 65  Asked about quitting: confirms that he currently smokes cigarettes Advise to quit smoking: Educated about QUITTING to reduce the risk of cancer, cardio and cerebrovascular disease. Assess willingness: Unwilling to quit at this time, but is working on cutting back. Assist with counseling and pharmacotherapy: Counseled for 5 minutes and literature provided. Arrange for follow up: follow up in 3 months and continue to offer help.

## 2021-02-12 NOTE — Assessment & Plan Note (Signed)
Ordered PSA after discussing its limitations for prostate cancer screening, including false positive results leading additional investigations. 

## 2021-02-12 NOTE — Assessment & Plan Note (Signed)
Could be related to mood disorder and/or alcohol abuse Vistaril as needed for now Sleep hygiene discussed

## 2021-02-13 DIAGNOSIS — N319 Neuromuscular dysfunction of bladder, unspecified: Secondary | ICD-10-CM | POA: Diagnosis not present

## 2021-02-19 ENCOUNTER — Other Ambulatory Visit: Payer: Self-pay

## 2021-02-19 ENCOUNTER — Encounter: Payer: Self-pay | Admitting: Podiatry

## 2021-02-19 ENCOUNTER — Ambulatory Visit (INDEPENDENT_AMBULATORY_CARE_PROVIDER_SITE_OTHER): Payer: Medicare Other | Admitting: Podiatry

## 2021-02-19 DIAGNOSIS — B070 Plantar wart: Secondary | ICD-10-CM

## 2021-02-19 NOTE — Progress Notes (Signed)
  Subjective:  Patient ID: Warren Patrick, male    DOB: 1955/06/13,   MRN: [id number]  Chief Complaint  Patient presents with   Plantar Warts    Right foot plantar wart. Pt states he has been dealing with plantar warts for years and used a knife for self treatment.     65 y.o. male presents for concern of right foot plantar wart. Relates he has had it for several months and has tried OTC freezing treatments with minimal luck. Relates he has had them in the past but it has bee over 30 years . Denies any other pedal complaints. Denies n/v/f/c.   Past Medical History:  Diagnosis Date   Drug-seeking behavior    ETOH abuse    Hematuria, microscopic    HTN (hypertension)    PTSD (post-traumatic stress disorder)    Vitamin D deficiency     Objective:  Physical Exam: Vascular: DP/PT pulses 2/4 bilateral. CFT <3 seconds. Normal hair growth on digits. No edema.  Skin. No lacerations or abrasions bilateral feet. Multiple capillary budding is noted throughout with cauliflower-like appearance and loss of skin tension lines within the lesion itself. Musculoskeletal: MMT 5/5 bilateral lower extremities in DF, PF, Inversion and Eversion. Deceased ROM in DF of ankle joint.  Neurological: Sensation intact to light touch.   Assessment:   1. Plantar wart of right foot      Plan:  Patient was evaluated and treated and all questions answered. -Discussed warts and their etiology with patient and treatment options.  -Hyperkeratotic tissue was debrided with chisel without incident. .  -Application of cantrhone provided today. Advised patient to remove bandaging tomorrow.  -Discussed future options such as laser treatment if unsuccessful.  -Advised good supportive shoes and inserts -Patient to return to office in 3 weeks or sooner if condition worsens.   Louann Sjogren, DPM

## 2021-02-26 ENCOUNTER — Other Ambulatory Visit: Payer: Self-pay | Admitting: Internal Medicine

## 2021-02-26 DIAGNOSIS — G4700 Insomnia, unspecified: Secondary | ICD-10-CM

## 2021-02-28 ENCOUNTER — Telehealth: Payer: Self-pay | Admitting: *Deleted

## 2021-02-28 NOTE — Telephone Encounter (Signed)
Patient is calling to cancel his upcoming appointment,please call.

## 2021-03-05 ENCOUNTER — Ambulatory Visit: Payer: Medicare Other | Admitting: Podiatry

## 2021-03-18 ENCOUNTER — Ambulatory Visit: Payer: Medicare Other

## 2021-03-18 ENCOUNTER — Other Ambulatory Visit: Payer: Self-pay

## 2021-03-26 ENCOUNTER — Other Ambulatory Visit: Payer: Self-pay

## 2021-03-26 ENCOUNTER — Ambulatory Visit: Payer: Commercial Managed Care - HMO

## 2021-04-03 ENCOUNTER — Ambulatory Visit (INDEPENDENT_AMBULATORY_CARE_PROVIDER_SITE_OTHER): Payer: Commercial Managed Care - HMO

## 2021-04-03 ENCOUNTER — Other Ambulatory Visit: Payer: Self-pay

## 2021-04-03 DIAGNOSIS — Z0000 Encounter for general adult medical examination without abnormal findings: Secondary | ICD-10-CM | POA: Diagnosis not present

## 2021-04-03 DIAGNOSIS — Z0100 Encounter for examination of eyes and vision without abnormal findings: Secondary | ICD-10-CM | POA: Diagnosis not present

## 2021-04-03 DIAGNOSIS — Z1211 Encounter for screening for malignant neoplasm of colon: Secondary | ICD-10-CM | POA: Diagnosis not present

## 2021-04-03 DIAGNOSIS — Z122 Encounter for screening for malignant neoplasm of respiratory organs: Secondary | ICD-10-CM

## 2021-04-03 NOTE — Progress Notes (Signed)
Subjective:   Warren Patrick is a 66 y.o. male who presents for Medicare Annual/Subsequent preventive examination. I connected with  Aldean Jewett on 04/03/21 by a audio enabled telemedicine application and verified that I am speaking with the correct person using two identifiers.  Patient Location: Home  Provider Location: Office/Clinic  I discussed the limitations of evaluation and management by telemedicine. The patient expressed understanding and agreed to proceed.  Review of Systems    Defer to PCP Cardiac Risk Factors include: advanced age (>54men, >63 women);hypertension;male gender;smoking/ tobacco exposure     Objective:    Today's Vitals   04/03/21 1622  PainSc: 0-No pain   There is no height or weight on file to calculate BMI.  Advanced Directives 04/03/2021 05/05/2018 03/30/2017 04/13/2016 01/27/2016 12/18/2015 11/26/2015  Does Patient Have a Medical Advance Directive? No No No No No No No  Would patient like information on creating a medical advance directive? No - Patient declined No - Patient declined No - Patient declined No - Patient declined No - patient declined information - No - patient declined information    Current Medications (verified) Outpatient Encounter Medications as of 04/03/2021  Medication Sig   hydrOXYzine (VISTARIL) 25 MG capsule TAKE 1 CAPSULE (25 MG TOTAL) BY MOUTH AT BEDTIME AS NEEDED FOR ANXIETY (INSOMNIA).   omeprazole (PRILOSEC) 20 MG capsule Take 1 capsule (20 mg total) by mouth daily.   tamsulosin (FLOMAX) 0.4 MG CAPS capsule Take 1 capsule (0.4 mg total) by mouth daily.   No facility-administered encounter medications on file as of 04/03/2021.    Allergies (verified) Bee venom   History: Past Medical History:  Diagnosis Date   Drug-seeking behavior    ETOH abuse    Hematuria, microscopic    HTN (hypertension)    PTSD (post-traumatic stress disorder)    Vitamin D deficiency    Past Surgical History:  Procedure Laterality  Date   APPENDECTOMY     ROTATOR CUFF REPAIR     right shoulder, on 10/2013   TONSILLECTOMY     Family History  Problem Relation Age of Onset   Cancer Mother        breast cancer   Cancer Father        lung cancer   Hypertension Father    Diabetes Sister    Cancer Brother        pancreatic cancer   Vision loss Daughter    Diabetes Daughter    Social History   Socioeconomic History   Marital status: Divorced    Spouse name: Not on file   Number of children: 0   Years of education: 9   Highest education level: GED or equivalent  Occupational History   Occupation: retired  Tobacco Use   Smoking status: Every Day    Packs/day: 0.50    Years: 45.00    Pack years: 22.50    Types: Cigarettes   Smokeless tobacco: Never   Tobacco comments:    1/2PPD  Vaping Use   Vaping Use: Never used  Substance and Sexual Activity   Alcohol use: Yes    Alcohol/week: 28.0 standard drinks    Types: 28 Standard drinks or equivalent per week    Comment: a pint of Bourbon a month   Drug use: No   Sexual activity: Yes    Partners: Female  Other Topics Concern   Not on file  Social History Narrative    Divorced. Retired from Nurse, children's. Served in  the [employer]. 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 No children. Lives with current fiance, have been in a relationship for 7 years.   Social Determinants of Health   Financial Resource Strain: Low Risk    Difficulty of Paying Living Expenses: Not hard at all  Food Insecurity: No Food Insecurity   Worried About Charity fundraiser in the Last Year: Never true   [place] in the Last Year: Never true  Transportation Needs: No Transportation Needs   Lack of Transportation (Medical): No   Lack of Transportation (Non-Medical): No  Physical Activity: Unknown   Days of Exercise per Week: 7 days   Minutes of Exercise per Session: Not on file  Stress: No Stress Concern Present   Feeling of Stress : Not at all  Social  Connections: Moderately Isolated   Frequency of Communication with Friends and Family: More than three times a week   Frequency of Social Gatherings with Friends and Family: Never   Attends Religious Services: 1 to 4 times per year   Active Member of [employer] or Organizations: No   Attends Music therapist: Never   Marital Status: Divorced    Tobacco Counseling Ready to quit: Not Answered Counseling given: Not Answered Tobacco comments: 1/2PPD   Clinical Intake:  Pre-visit preparation completed: No  Pain : No/denies pain Pain Score: 0-No pain     Nutritional Risks: None Diabetes: No  How often do you need to have someone help you when you read instructions, pamphlets, or other written materials from your doctor or pharmacy?: 1 - Never What is the last grade level you completed in school?: GED  Diabetic?no  Interpreter Needed?: No  Information entered by :: [place] of Daily Living In your present state of health, do you have any difficulty performing the following activities: 04/03/2021 02/12/2021  Hearing? N N  Vision? N N  Difficulty concentrating or making decisions? N N  Walking or climbing stairs? N N  Dressing or bathing? N N  Doing errands, shopping? N N  Preparing Food and eating ? N -  Using the Toilet? N -  In the past six months, have you accidently leaked urine? Y -  Do you have problems with loss of bowel control? N -  Managing your Medications? N -  Managing your Finances? N -  Housekeeping or managing your Housekeeping? N -  Some recent data might be hidden    Patient Care Team: Lindell Spar, MD as PCP - General (Internal Medicine) Marcelyn Bruins, MD (Internal Medicine)  Indicate any recent Medical Services you may have received from other than [facility] providers in the past year (date may be approximate).     Assessment:   This is a routine wellness examination for [place].  Hearing/Vision screen No results  found.  Dietary issues and exercise activities discussed: Current Exercise Habits: Home exercise routine, Intensity: Mild, Exercise limited by: None identified   Goals Addressed   None   Depression Screen PHQ 2/9 Scores 04/03/2021 02/12/2021 04/13/2016 01/27/2016 08/26/2015 07/22/2015 06/26/2015  PHQ - 2 Score 0 0 5 0 0 3 3  PHQ- 9 Score - - 18 - 7 12 -    Fall Risk Fall Risk  04/03/2021 02/12/2021 01/27/2016 10/07/2015 08/26/2015  Falls in the past year? 1 0 Yes Yes Yes  Number falls in past yr: 0 0 2 or more 2 or more 2 or more  Injury with Fall? 0  0 - Yes Yes  Risk Factor Category  - - High Fall Risk High Fall Risk High Fall Risk  Risk for fall due to : - No Fall Risks History of fall(s) History of fall(s);Impaired balance/gait History of fall(s)  Follow up Falls evaluation completed Falls evaluation completed - Falls prevention discussed Education provided    FALL RISK PREVENTION PERTAINING TO THE HOME:  Any stairs in or around the home? Yes  If so, are there any without handrails? Yes  Home free of loose throw rugs in walkways, pet beds, electrical cords, etc? Yes  Adequate lighting in your home to reduce risk of falls? Yes   ASSISTIVE DEVICES UTILIZED TO PREVENT FALLS:  Life alert? Yes  Use of a cane, walker or w/c? No  Grab bars in the bathroom? No  Shower chair or bench in shower? Yes  Elevated toilet seat or a handicapped toilet? Yes    Cognitive Function:     6CIT Screen 04/03/2021  What Year? 0 points  What month? 0 points    Immunizations Immunization History  Administered Date(s) Administered   Influenza,inj,Quad PF,6+ Mos 04/13/2016, 01/26/2019, 12/28/2019   Pneumococcal Polysaccharide-23 05/24/2018   Td 02/18/2011, 11/27/2015    TDAP status: Up to date  Flu Vaccine status: Due, Education has been provided regarding the importance of this vaccine. Advised may receive this vaccine at local pharmacy or Health Dept. Aware to provide a copy of the vaccination  record if obtained from local pharmacy or Health Dept. Verbalized acceptance and understanding.  Pneumococcal vaccine status: Due, Education has been provided regarding the importance of this vaccine. Advised may receive this vaccine at local pharmacy or Health Dept. Aware to provide a copy of the vaccination record if obtained from local pharmacy or Health Dept. Verbalized acceptance and understanding.  Covid-19 vaccine status: Information provided on how to obtain vaccines.   Qualifies for Shingles Vaccine? Yes   Zostavax completed No   Shingrix Completed?: No.    Education has been provided regarding the importance of this vaccine. Patient has been advised to call insurance company to determine out of pocket expense if they have not yet received this vaccine. Advised may also receive vaccine at local pharmacy or Health Dept. Verbalized acceptance and understanding.  Screening Tests Health Maintenance  Topic Date Due   COVID-19 Vaccine (1) Never done   COLONOSCOPY (Pts 45-50yrs Insurance coverage will need to be confirmed)  Never done   Zoster Vaccines- Shingrix (1 of 2) Never done   COLON CANCER SCREENING ANNUAL FOBT  05/30/2016   Pneumonia Vaccine 60+ Years old (2 - PCV) 05/24/2019   INFLUENZA VACCINE  10/14/2020   TETANUS/TDAP  11/26/2025   Hepatitis C Screening  Completed   HPV VACCINES  Aged Out    Health Maintenance  Health Maintenance Due  Topic Date Due   COVID-19 Vaccine (1) Never done   COLONOSCOPY (Pts 45-46yrs Insurance coverage will need to be confirmed)  Never done   Zoster Vaccines- Shingrix (1 of 2) Never done   COLON CANCER SCREENING ANNUAL FOBT  05/30/2016   Pneumonia Vaccine 23+ Years old (2 - PCV) 05/24/2019   INFLUENZA VACCINE  10/14/2020    Colorectal cancer screening: Referral to GI placed 04/03/2021. Pt aware the office will call re: appt.  Lung Cancer Screening: (Low Dose CT Chest recommended if Age 57-80 years, 30 pack-year currently smoking OR have  quit w/in 15years.) does qualify.   Lung Cancer Screening Referral: 04/03/2021  Additional Screening:  Hepatitis  C Screening: does qualify; Completed 05/28/2018  Vision Screening: Recommended annual ophthalmology exams for early detection of glaucoma and other disorders of the eye. Is the patient up to date with their annual eye exam?  No  Who is the provider or what is the name of the office in which the patient attends annual eye exams? Don't have one If pt is not established with a provider, would they like to be referred to a provider to establish care? Yes .   Dental Screening: Recommended annual dental exams for proper oral hygiene  Community Resource Referral / Chronic Care Management: CRR required this visit?  No   CCM required this visit?  No      Plan:     I have personally reviewed and noted the following in the patients chart:   Medical and social history Use of alcohol, tobacco or illicit drugs  Current medications and supplements including opioid prescriptions. Patient is not currently taking opioid prescriptions. Functional ability and status Nutritional status Physical activity Advanced directives List of other physicians Hospitalizations, surgeries, and ER visits in previous 12 months Vitals Screenings to include cognitive, depression, and falls Referrals and appointments  In addition, I have reviewed and discussed with patient certain preventive protocols, quality metrics, and best practice recommendations. A written personalized care plan for preventive services as well as general preventive health recommendations were provided to patient.     Earline Mayotte, [place]   04/03/2021   Nurse Notes:  Mr. Mcgonagle , Thank you for taking time to come for your Medicare Wellness Visit. I appreciate your ongoing commitment to your health goals. Please review the following plan we discussed and let me know if I can assist you in the future.   These are the goals  we discussed:  Goals      Blood Pressure < 140/90        This is a list of the screening recommended for you and due dates:  Health Maintenance  Topic Date Due   COVID-19 Vaccine (1) Never done   Colon Cancer Screening  Never done   Zoster (Shingles) Vaccine (1 of 2) Never done   Stool Blood Test  05/30/2016   Pneumonia Vaccine (2 - PCV) 05/24/2019   Flu Shot  10/14/2020   Tetanus Vaccine  11/26/2025   Hepatitis C Screening: USPSTF Recommendation to screen - Ages 18-79 yo.  Completed   HPV Vaccine  Aged Out

## 2021-04-03 NOTE — Patient Instructions (Addendum)
Mr. Warren Patrick , Thank you for taking time to come for your Medicare Wellness Visit. I appreciate your ongoing commitment to your health goals. Please review the following plan we discussed and let me know if I can assist you in the future.   These are the goals we discussed:  Goals      Blood Pressure < 140/90        This is a list of the screening recommended for you and due dates:  Health Maintenance  Topic Date Due   COVID-19 Vaccine (1) Never done   Colon Cancer Screening  Never done   Zoster (Shingles) Vaccine (1 of 2) Never done   Stool Blood Test  05/30/2016   Pneumonia Vaccine (2 - PCV) 05/24/2019   Flu Shot  10/14/2020   Tetanus Vaccine  11/26/2025   Hepatitis C Screening: USPSTF Recommendation to screen - Ages 18-79 yo.  Completed   HPV Vaccine  Aged Out    Health Maintenance, Male Adopting a healthy lifestyle and getting preventive care are important in promoting health and wellness. Ask your health care provider about: The right schedule for you to have regular tests and exams. Things you can do on your own to prevent diseases and keep yourself healthy. What should I know about diet, weight, and exercise? Eat a healthy diet  Eat a diet that includes plenty of vegetables, fruits, low-fat dairy products, and lean protein. Do not eat a lot of foods that are high in solid fats, added sugars, or sodium. Maintain a healthy weight Body mass index (BMI) is a measurement that can be used to identify possible weight problems. It estimates body fat based on height and weight. Your health care provider can help determine your BMI and help you achieve or maintain a healthy weight. Get regular exercise Get regular exercise. This is one of the most important things you can do for your health. Most adults should: Exercise for at least 150 minutes each week. The exercise should increase your heart rate and make you sweat (moderate-intensity exercise). Do strengthening exercises at  least twice a week. This is in addition to the moderate-intensity exercise. Spend less time sitting. Even light physical activity can be beneficial. Watch cholesterol and blood lipids Have your blood tested for lipids and cholesterol at 67 years of age, then have this test every 5 years. You may need to have your cholesterol levels checked more often if: Your lipid or cholesterol levels are high. You are older than 67 years of age. You are at high risk for heart disease. What should I know about cancer screening? Many types of cancers can be detected early and may often be prevented. Depending on your health history and family history, you may need to have cancer screening at various ages. This may include screening for: Colorectal cancer. Prostate cancer. Skin cancer. Lung cancer. What should I know about heart disease, diabetes, and high blood pressure? Blood pressure and heart disease High blood pressure causes heart disease and increases the risk of stroke. This is more likely to develop in people who have high blood pressure readings or are overweight. Talk with your health care provider about your target blood pressure readings. Have your blood pressure checked: Every 3-5 years if you are 2-19 years of age. Every year if you are 17 years old or older. If you are between the ages of 73 and 81 and are a current or former smoker, ask your health care provider if you should have  a one-time screening for abdominal aortic aneurysm (AAA). Diabetes Have regular diabetes screenings. This checks your fasting blood sugar level. Have the screening done: Once every three years after age 21 if you are at a normal weight and have a low risk for diabetes. More often and at a younger age if you are overweight or have a high risk for diabetes. What should I know about preventing infection? Hepatitis B If you have a higher risk for hepatitis B, you should be screened for this virus. Talk with your  health care provider to find out if you are at risk for hepatitis B infection. Hepatitis C Blood testing is recommended for: Everyone born from [birth year] through 1966. Anyone with known risk factors for hepatitis C. Sexually transmitted infections (STIs) You should be screened each year for STIs, including gonorrhea and chlamydia, if: You are sexually active and are younger than 67 years of age. You are older than 67 years of age and your health care provider tells you that you are at risk for this type of infection. Your sexual activity has changed since you were last screened, and you are at increased risk for chlamydia or gonorrhea. Ask your health care provider if you are at risk. Ask your health care provider about whether you are at high risk for HIV. Your health care provider may recommend a prescription medicine to help prevent HIV infection. If you choose to take medicine to prevent HIV, you should first get tested for HIV. You should then be tested every 3 months for as long as you are taking the medicine. Follow these instructions at home: Alcohol use Do not drink alcohol if your health care provider tells you not to drink. If you drink alcohol: Limit how much you have to 0-2 drinks a day. Know how much alcohol is in your drink. In the [place] one drink equals one 12 oz bottle of beer (355 mL), one 5 oz glass of wine (148 mL), or one 1 oz glass of hard liquor (44 mL). Lifestyle Do not use any products that contain nicotine or tobacco. These products include cigarettes, chewing tobacco, and vaping devices, such as e-cigarettes. If you need help quitting, ask your health care provider. Do not use street drugs. Do not share needles. Ask your health care provider for help if you need support or information about quitting drugs. General instructions Schedule regular health, dental, and eye exams. Stay current with your vaccines. Tell your health care provider if: You often feel  depressed. You have ever been abused or do not feel safe at home. Summary Adopting a healthy lifestyle and getting preventive care are important in promoting health and wellness. Follow your health care provider's instructions about healthy diet, exercising, and getting tested or screened for diseases. Follow your health care provider's instructions on monitoring your cholesterol and blood pressure. This information is not intended to replace advice given to you by your health care provider. Make sure you discuss any questions you have with your health care provider. Document Revised: 07/22/2020 Document Reviewed: 07/22/2020 Elsevier Patient Education  2022 [employer].

## 2021-04-10 ENCOUNTER — Encounter: Payer: Self-pay | Admitting: Internal Medicine

## 2021-05-14 ENCOUNTER — Ambulatory Visit (HOSPITAL_COMMUNITY): Payer: Medicare Other

## 2021-05-15 ENCOUNTER — Ambulatory Visit: Payer: Medicare Other | Admitting: Internal Medicine

## 2021-06-10 ENCOUNTER — Ambulatory Visit: Payer: Self-pay

## 2021-06-10 ENCOUNTER — Encounter: Payer: Self-pay | Admitting: *Deleted

## 2021-06-10 ENCOUNTER — Telehealth: Payer: Self-pay | Admitting: *Deleted

## 2021-06-10 ENCOUNTER — Other Ambulatory Visit: Payer: Self-pay

## 2021-06-10 NOTE — Telephone Encounter (Signed)
Tried to call 10:00 nurse visit by phone x 2 times.  Left a voice mail for pt. ?

## 2021-06-17 ENCOUNTER — Telehealth: Payer: Self-pay

## 2021-06-17 NOTE — Telephone Encounter (Signed)
Patient called said his prescription expired for bed pads.  Send to [facility]  ?

## 2021-06-17 NOTE — Telephone Encounter (Signed)
Called patient & left voice mail.

## 2021-06-17 NOTE — Telephone Encounter (Signed)
Please have pt call [facility] to fax over the prescription ?

## 2021-06-19 ENCOUNTER — Ambulatory Visit (HOSPITAL_COMMUNITY): Payer: Medicare Other

## 2021-07-17 ENCOUNTER — Other Ambulatory Visit: Payer: Self-pay | Admitting: Internal Medicine

## 2021-07-17 DIAGNOSIS — N401 Enlarged prostate with lower urinary tract symptoms: Secondary | ICD-10-CM

## 2021-07-17 DIAGNOSIS — K219 Gastro-esophageal reflux disease without esophagitis: Secondary | ICD-10-CM

## 2021-07-31 ENCOUNTER — Encounter: Payer: Medicare Other | Admitting: Internal Medicine

## 2021-10-07 ENCOUNTER — Ambulatory Visit (INDEPENDENT_AMBULATORY_CARE_PROVIDER_SITE_OTHER): Payer: Medicare Other | Admitting: Internal Medicine

## 2021-10-07 ENCOUNTER — Encounter: Payer: Self-pay | Admitting: Internal Medicine

## 2021-10-07 VITALS — BP 144/88 | HR 66 | Ht 69.0 in | Wt 133.6 lb

## 2021-10-07 DIAGNOSIS — N401 Enlarged prostate with lower urinary tract symptoms: Secondary | ICD-10-CM | POA: Diagnosis not present

## 2021-10-07 DIAGNOSIS — F17210 Nicotine dependence, cigarettes, uncomplicated: Secondary | ICD-10-CM

## 2021-10-07 DIAGNOSIS — J3089 Other allergic rhinitis: Secondary | ICD-10-CM | POA: Diagnosis not present

## 2021-10-07 DIAGNOSIS — J309 Allergic rhinitis, unspecified: Secondary | ICD-10-CM | POA: Insufficient documentation

## 2021-10-07 DIAGNOSIS — Z23 Encounter for immunization: Secondary | ICD-10-CM

## 2021-10-07 DIAGNOSIS — Z720 Tobacco use: Secondary | ICD-10-CM | POA: Diagnosis not present

## 2021-10-07 DIAGNOSIS — Z1211 Encounter for screening for malignant neoplasm of colon: Secondary | ICD-10-CM

## 2021-10-07 DIAGNOSIS — K219 Gastro-esophageal reflux disease without esophagitis: Secondary | ICD-10-CM

## 2021-10-07 DIAGNOSIS — R350 Frequency of micturition: Secondary | ICD-10-CM

## 2021-10-07 DIAGNOSIS — I10 Essential (primary) hypertension: Secondary | ICD-10-CM

## 2021-10-07 DIAGNOSIS — G4700 Insomnia, unspecified: Secondary | ICD-10-CM | POA: Diagnosis not present

## 2021-10-07 MED ORDER — TRAZODONE HCL 50 MG PO TABS
50.0000 mg | ORAL_TABLET | Freq: Every evening | ORAL | 3 refills | Status: DC | PRN
Start: 2021-10-07 — End: 2021-10-29

## 2021-10-07 MED ORDER — AMLODIPINE BESYLATE 5 MG PO TABS: 5.0000 mg | ORAL_TABLET | Freq: Every day | ORAL | 1 refills | Status: DC

## 2021-10-07 MED ORDER — LEVOCETIRIZINE DIHYDROCHLORIDE 5 MG PO TABS: 5.0000 mg | ORAL_TABLET | Freq: Every evening | ORAL | 1 refills | Status: DC

## 2021-10-07 NOTE — Assessment & Plan Note (Signed)
BP Readings from Last 1 Encounters:  10/07/21 (!) 144/88   Uncontrolled Started Amlodipine 5 mg QD Advised DASH diet and moderate exercise/walking, at least 150 mins/week

## 2021-10-07 NOTE — Assessment & Plan Note (Signed)
Admits eating hot and spicy hotdogs On Omeprazole Needs to avoid hot and spicy food, and cut down alcohol and tobacco use

## 2021-10-07 NOTE — Patient Instructions (Signed)
Please start taking Amlodipine for high blood pressure.  Please start taking Trazodone for insomnia.  Please maintain simple sleep hygiene. - Maintain dark and non-noisy environment in the bedroom. - Please use the bedroom for sleep and sexual activity only. - Do not use electronic devices in the bedroom. - Please take dinner at least 2 hours before bedtime. - Please avoid caffeinated products in the evening, including coffee, soft drinks. - Please try to maintain the regular sleep-wake cycle - Go to bed and wake up at the same time.   Please take Xyzal for allergies.

## 2021-10-07 NOTE — Assessment & Plan Note (Signed)
Could be related to mood disorder and/or alcohol abuse Uncontrolled with Vistaril as needed Switch to trazodone as needed Avoid BZDs due to h/o alcohol abuse Sleep hygiene discussed

## 2021-10-07 NOTE — Assessment & Plan Note (Signed)
Smokes about 0.3 pack/day, used to smoke 1 pack/day, started at age 66  Asked about quitting: confirms that he currently smokes cigarettes Advise to quit smoking: Educated about QUITTING to reduce the risk of cancer, cardio and cerebrovascular disease. Assess willingness: Unwilling to quit at this time, but is working on cutting back. Assist with counseling and pharmacotherapy: Counseled for 5 minutes and literature provided. Arrange for follow up: follow up in 3 months and continue to offer help.

## 2021-10-07 NOTE — Assessment & Plan Note (Signed)
Chronic urinary frequency and hesitancy Had started Flomax, but had diarrhea - unsure if due to medicine or food - advised to take it again for now

## 2021-10-07 NOTE — Assessment & Plan Note (Signed)
Started Xyzal

## 2021-10-07 NOTE — Addendum Note (Signed)
Addended by: Ishmael Holter R on: 10/07/2021 02:08 PM   Modules accepted: Orders

## 2021-10-07 NOTE — Progress Notes (Signed)
Established Patient Office Visit  Subjective:  Patient ID: Warren Patrick, male    DOB: 1955-06-07  Age: 66 y.o. MRN: [id number]  CC:  Chief Complaint  Patient presents with   Follow-up    Med review    HPI Warren Patrick is a 66 y.o. male with past medical history of HTN, GERD, insomnia, tobacco and alcohol abuse who presents for f/u of his chronic medical conditions.  HTN: His BP was elevated today.  He was on amlodipine in the past.  He denies any headache, dizziness, chest pain, dyspnea or palpitations currently.  Insomnia: He has tried taking Vistaril 25 to 50 mg nightly as needed without much relief.  He also tried melatonin and NyQuil.  He was able to sleep only about 2 hours last night.  He drinks alcohol to help with insomnia.  He states that only Xanax works for him.  GERD: Takes omeprazole.  Denies any nausea or vomiting currently.  He complains of diarrhea for 1 hour after taking Flomax only once.  He agrees to try taking it again to see if it was actually due to Flomax.  He complains of nasal congestion, postnasal drip and sneezing due to allergies.  Denies any fever or chills currently.     Past Medical History:  Diagnosis Date   Drug-seeking behavior    ETOH abuse    Hematuria, microscopic    HTN (hypertension)    PTSD (post-traumatic stress disorder)    Vitamin D deficiency     Past Surgical History:  Procedure Laterality Date   APPENDECTOMY     ROTATOR CUFF REPAIR     right shoulder, on 10/2013   TONSILLECTOMY      Family History  Problem Relation Age of Onset   Cancer Mother        breast cancer   Cancer Father        lung cancer   Hypertension Father    Diabetes Sister    Cancer Brother        pancreatic cancer   Vision loss Daughter    Diabetes Daughter     Social History   Socioeconomic History   Marital status: Divorced    Spouse name: Not on file   Number of children: 0   Years of education: 9   Highest education level: GED  or equivalent  Occupational History   Occupation: retired  Tobacco Use   Smoking status: Every Day    Packs/day: 0.50    Years: 45.00    Total pack years: 22.50    Types: Cigarettes   Smokeless tobacco: Never   Tobacco comments:    1/2PPD  Vaping Use   Vaping Use: Never used  Substance and Sexual Activity   Alcohol use: Yes    Alcohol/week: 28.0 standard drinks of alcohol    Types: 28 Standard drinks or equivalent per week    Comment: a pint of Bourbon a month   Drug use: No   Sexual activity: Yes    Partners: Female  Other Topics Concern   Not on file  Social History Narrative    Divorced. 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 Lives with current fiance, have been in a relationship for 7 years.   Social Determinants of Health   Financial Resource Strain: Low Risk  (04/03/2021)   Overall Financial Resource Strain (CARDIA)    Difficulty of Paying Living Expenses: Not  hard at all  Food Insecurity: No Food Insecurity (04/03/2021)   Hunger Vital Sign    Worried About Running Out of Food in the Last Year: Never true    Ran Out of Food in the Last Year: Never true  Transportation Needs: No Transportation Needs (04/03/2021)   PRAPARE - Administrator, Civil Service (Medical): No    Lack of Transportation (Non-Medical): No  Physical Activity: Unknown (04/03/2021)   Exercise Vital Sign    Days of Exercise per Week: 7 days    Minutes of Exercise per Session: Not on file  Stress: No Stress Concern Present (04/03/2021)   [employer] of Occupational Health - Occupational Stress Questionnaire    Feeling of Stress : Not at all  Social Connections: Moderately Isolated (04/03/2021)   Social Connection and Isolation Panel [NHANES]    Frequency of Communication with Friends and Family: More than three times a week    Frequency of Social Gatherings with Friends and Family: Never    Attends Religious  Services: 1 to 4 times per year    Active Member of [employer] or Organizations: No    Attends Banker Meetings: Never    Marital Status: Divorced  Catering manager Violence: Not At Risk (04/03/2021)   Humiliation, Afraid, Rape, and Kick questionnaire    Fear of Current or Ex-Partner: No    Emotionally Abused: No    Physically Abused: No    Sexually Abused: No    Outpatient Medications Prior to Visit  Medication Sig Dispense Refill   omeprazole (PRILOSEC) 20 MG capsule TAKE 1 CAPSULE BY MOUTH EVERY DAY 90 capsule 2   hydrOXYzine (VISTARIL) 25 MG capsule TAKE 1 CAPSULE (25 MG TOTAL) BY MOUTH AT BEDTIME AS NEEDED FOR ANXIETY (INSOMNIA). 90 capsule 1   tamsulosin (FLOMAX) 0.4 MG CAPS capsule TAKE 1 CAPSULE BY MOUTH EVERY DAY (Patient not taking: Reported on 10/07/2021) 90 capsule 2   No facility-administered medications prior to visit.    Allergies  Allergen Reactions   Bee Venom Swelling    ROS Review of Systems  Constitutional:  Negative for chills and fever.  HENT:  Positive for congestion, postnasal drip, rhinorrhea, sinus pressure and sneezing. Negative for sore throat.   Eyes:  Negative for pain and discharge.  Respiratory:  Negative for cough and shortness of breath.   Cardiovascular:  Negative for chest pain and palpitations.  Gastrointestinal:  Negative for diarrhea, nausea and vomiting.  Endocrine: Negative for polydipsia and polyuria.  Genitourinary:  Positive for frequency. Negative for dysuria and hematuria.  Musculoskeletal:  Negative for neck pain and neck stiffness.  Skin:  Negative for rash.  Neurological:  Negative for dizziness, weakness, numbness and headaches.  Psychiatric/Behavioral:  Positive for sleep disturbance. Negative for agitation and behavioral problems.       Objective:    Physical Exam Vitals reviewed.  Constitutional:      General: He is not in acute distress.    Appearance: He is not diaphoretic.  HENT:     Head: Normocephalic  and atraumatic.     Nose: Nose normal.     Mouth/Throat:     Mouth: Mucous membranes are moist.  Eyes:     General: No scleral icterus.    Extraocular Movements: Extraocular movements intact.  Cardiovascular:     Rate and Rhythm: Normal rate and regular rhythm.     Pulses: Normal pulses.     Heart sounds: Normal heart sounds. No murmur heard. Pulmonary:  Breath sounds: Normal breath sounds. No wheezing or rales.  Abdominal:     Palpations: Abdomen is soft.     Tenderness: There is abdominal tenderness (Epigastric, mild).  Musculoskeletal:     Cervical back: Neck supple. No tenderness.     Right lower leg: No edema.     Left lower leg: No edema.  Skin:    General: Skin is warm.     Findings: No rash.  Neurological:     General: No focal deficit present.     Mental Status: He is alert and oriented to person, place, and time.     Sensory: No sensory deficit.     Motor: No weakness.  Psychiatric:        Mood and Affect: Mood normal.        Behavior: Behavior normal.     BP (!) 144/88 (BP Location: Left Arm, Cuff Size: Normal)   Pulse 66   Ht 5\' 9"  (1.753 m)   Wt 133 lb 9.6 oz (60.6 kg)   SpO2 97%   BMI 19.73 kg/m  Wt Readings from Last 3 Encounters:  10/07/21 133 lb 9.6 oz (60.6 kg)  02/12/21 139 lb 0.6 oz (63.1 kg)  12/28/19 138 lb 9.6 oz (62.9 kg)    No results found for: "TSH" Lab Results  Component Value Date   WBC 7.1 11/29/2019   HGB 17.6 (H) 11/29/2019   HCT 51.5 (H) 11/29/2019   MCV 99.4 11/29/2019   PLT 210 11/29/2019   Lab Results  Component Value Date   NA 140 11/29/2019   K 3.9 11/29/2019   CO2 30 11/29/2019   GLUCOSE 68 11/29/2019   BUN 6 (L) 11/29/2019   CREATININE 0.99 11/29/2019   BILITOT 1.0 11/29/2019   ALKPHOS 53 03/30/2017   AST 34 11/29/2019   ALT 21 11/29/2019   PROT 7.1 11/29/2019   ALBUMIN 4.1 03/30/2017   CALCIUM 9.5 11/29/2019   ANIONGAP 9 03/30/2017   Lab Results  Component Value Date   CHOL 156 06/12/2019   Lab  Results  Component Value Date   HDL 56 06/12/2019   Lab Results  Component Value Date   LDLCALC 79 06/12/2019   Lab Results  Component Value Date   TRIG 116 06/12/2019   Lab Results  Component Value Date   CHOLHDL 2.8 06/12/2019   No results found for: "HGBA1C"    Assessment & Plan:   Problem List Items Addressed This Visit       Cardiovascular and Mediastinum   Essential hypertension - Primary    BP Readings from Last 1 Encounters:  10/07/21 (!) 144/88  Uncontrolled Started Amlodipine 5 mg QD Advised DASH diet and moderate exercise/walking, at least 150 mins/week       Relevant Medications   amLODipine (NORVASC) 5 MG tablet     Respiratory   Allergic rhinitis    Started Xyzal      Relevant Medications   levocetirizine (XYZAL) 5 MG tablet     Digestive   Gastroesophageal reflux disease    Admits eating hot and spicy hotdogs On Omeprazole Needs to avoid hot and spicy food, and cut down alcohol and tobacco use        Other   Tobacco abuse    Smokes about 0.3 pack/day, used to smoke 1 pack/day, started at age 1  Asked about quitting: confirms that he currently smokes cigarettes Advise to quit smoking: Educated about QUITTING to reduce the risk of cancer, cardio and cerebrovascular  disease. Assess willingness: Unwilling to quit at this time, but is working on cutting back. Assist with counseling and pharmacotherapy: Counseled for 5 minutes and literature provided. Arrange for follow up: follow up in 3 months and continue to offer help.       Insomnia    Could be related to mood disorder and/or alcohol abuse Uncontrolled with Vistaril as needed Switch to trazodone as needed Avoid BZDs due to h/o alcohol abuse Sleep hygiene discussed      Relevant Medications   traZODone (DESYREL) 50 MG tablet   Benign prostatic hyperplasia with urinary frequency    Chronic urinary frequency and hesitancy Had started Flomax, but had diarrhea - unsure if due to  medicine or food - advised to take it again for now      Relevant Medications   amLODipine (NORVASC) 5 MG tablet   Other Visit Diagnoses     Colon cancer screening       Relevant Orders   Cologuard       Meds ordered this encounter  Medications   traZODone (DESYREL) 50 MG tablet    Sig: Take 1 tablet (50 mg total) by mouth at bedtime as needed for sleep.    Dispense:  30 tablet    Refill:  3   amLODipine (NORVASC) 5 MG tablet    Sig: Take 1 tablet (5 mg total) by mouth daily.    Dispense:  90 tablet    Refill:  1   levocetirizine (XYZAL) 5 MG tablet    Sig: Take 1 tablet (5 mg total) by mouth every evening.    Dispense:  90 tablet    Refill:  1    Follow-up: Return in about 4 months (around 02/07/2022) for Annual physical.    Anabel Halon, MD

## 2021-10-14 ENCOUNTER — Other Ambulatory Visit: Payer: Self-pay | Admitting: Internal Medicine

## 2021-10-29 ENCOUNTER — Other Ambulatory Visit: Payer: Self-pay | Admitting: Internal Medicine

## 2021-10-29 DIAGNOSIS — G4700 Insomnia, unspecified: Secondary | ICD-10-CM

## 2022-02-12 ENCOUNTER — Encounter: Payer: Medicare Other | Admitting: Internal Medicine

## 2022-02-12 ENCOUNTER — Encounter: Payer: Self-pay | Admitting: Internal Medicine

## 2022-04-02 ENCOUNTER — Other Ambulatory Visit: Payer: Self-pay

## 2022-04-02 ENCOUNTER — Telehealth: Payer: Self-pay | Admitting: Internal Medicine

## 2022-04-02 DIAGNOSIS — K219 Gastro-esophageal reflux disease without esophagitis: Secondary | ICD-10-CM

## 2022-04-02 MED ORDER — OMEPRAZOLE 20 MG PO CPDR: DELAYED_RELEASE_CAPSULE | ORAL | 0 refills | Status: DC

## 2022-04-02 NOTE — Telephone Encounter (Signed)
Prescription Request  04/02/2022  Is this a "Controlled Substance" medicine? No  LOV: 10/07/2021  What is the name of the medication or equipment? omeprazole (PRILOSEC) 20 MG capsule   omeprazole (PRILOSEC) 20 MG capsule   Have you contacted your pharmacy to request a refill? No   Which pharmacy would you like this sent to?  [facility]   Patient notified that their request is being sent to the clinical staff for review and that they should receive a response within 2 business days.   Please advise at Mobile [phone] (mobile)

## 2022-04-02 NOTE — Telephone Encounter (Signed)
Refill sent to pharmacy.   

## 2022-04-05 ENCOUNTER — Other Ambulatory Visit: Payer: Self-pay | Admitting: Internal Medicine

## 2022-04-05 DIAGNOSIS — N401 Enlarged prostate with lower urinary tract symptoms: Secondary | ICD-10-CM

## 2022-04-05 DIAGNOSIS — J3089 Other allergic rhinitis: Secondary | ICD-10-CM

## 2022-04-07 ENCOUNTER — Ambulatory Visit (INDEPENDENT_AMBULATORY_CARE_PROVIDER_SITE_OTHER): Payer: 59 | Admitting: Internal Medicine

## 2022-04-07 ENCOUNTER — Encounter: Payer: Self-pay | Admitting: Internal Medicine

## 2022-04-07 DIAGNOSIS — Z0000 Encounter for general adult medical examination without abnormal findings: Secondary | ICD-10-CM

## 2022-04-07 NOTE — Progress Notes (Signed)
Subjective:  This is a telephone encounter between Warren Patrick and Warren Patrick on 04/07/2022 for AWV. The visit was conducted with the patient located at home and Warren Patrick at [facility]. The patient's identity was confirmed using their DOB and current address. The patient has consented to being evaluated through a telephone encounter and understands the associated risks (an examination cannot be done and the patient may need to come in for an appointment) / benefits (allows the patient to remain at home, decreasing exposure to coronavirus).     Warren Patrick is a 67 y.o. male who presents for Medicare Annual/Subsequent preventive examination.  Review of Systems    Review of Systems  All other systems reviewed and are negative.    Objective:    There were no vitals filed for this visit. There is no height or weight on file to calculate BMI.     04/07/2022    3:44 PM 04/03/2021    4:33 PM 05/05/2018    6:27 PM 03/30/2017    4:05 AM 04/13/2016    3:50 PM 01/27/2016    3:29 PM 12/18/2015   12:07 AM  Advanced Directives  Does Patient Have a Medical Advance Directive? No No No No No No No  Would patient like information on creating a medical advance directive? Yes (ED - Information included in AVS) No - Patient declined No - Patient declined No - Patient declined No - Patient declined No - patient declined information     Current Medications (verified) Outpatient Encounter Medications as of 04/07/2022  Medication Sig   amLODipine (NORVASC) 5 MG tablet TAKE 1 TABLET (5 MG TOTAL) BY MOUTH DAILY.   hydrOXYzine (VISTARIL) 25 MG capsule TAKE 1 CAPSULE (25 MG TOTAL) BY MOUTH AT BEDTIME AS NEEDED FOR ANXIETY (INSOMNIA).   levocetirizine (XYZAL) 5 MG tablet TAKE 1 TABLET BY MOUTH EVERY DAY IN THE EVENING   omeprazole (PRILOSEC) 20 MG capsule TAKE 1 CAPSULE BY MOUTH EVERY DAY   tamsulosin (FLOMAX) 0.4 MG CAPS capsule TAKE 1 CAPSULE BY MOUTH EVERY DAY (Patient not taking: Reported on  10/07/2021)   traZODone (DESYREL) 50 MG tablet TAKE 1 TABLET BY MOUTH AT BEDTIME AS NEEDED FOR SLEEP.   No facility-administered encounter medications on file as of 04/07/2022.    Allergies (verified) Bee venom   History: Past Medical History:  Diagnosis Date   Drug-seeking behavior    ETOH abuse    Hematuria, microscopic    HTN (hypertension)    PTSD (post-traumatic stress disorder)    Vitamin D deficiency    Past Surgical History:  Procedure Laterality Date   APPENDECTOMY     ROTATOR CUFF REPAIR     right shoulder, on 10/2013   TONSILLECTOMY     Family History  Problem Relation Age of Onset   Cancer Mother        breast cancer   Cancer Father        lung cancer   Hypertension Father    Diabetes Sister    Cancer Brother        pancreatic cancer   Vision loss Daughter    Diabetes Daughter    Social History   Socioeconomic History   Marital status: Divorced    Spouse name: Not on file   Number of children: 0   Years of education: 9   Highest education level: GED or equivalent  Occupational History   Occupation: retired  Tobacco Use   Smoking status: Every Day  Packs/day: 0.50    Years: 45.00    Total pack years: 22.50    Types: Cigarettes   Smokeless tobacco: Never   Tobacco comments:    1/2PPD  Vaping Use   Vaping Use: Never used  Substance and Sexual Activity   Alcohol use: Yes    Alcohol/week: 28.0 standard drinks of alcohol    Types: 28 Standard drinks or equivalent per week    Comment: a pint of Bourbon a month   Drug use: No   Sexual activity: Yes    Partners: Female  Other Topics Concern   Not on file  Social History Narrative    Divorced. 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 Lives with current fiance, have been in a relationship for 7 years.   Social Determinants of Health   Financial Resource Strain: Low Risk  (04/03/2021)   Overall Financial Resource Strain  (CARDIA)    Difficulty of Paying Living Expenses: Not hard at all  Food Insecurity: No Food Insecurity (04/03/2021)   Hunger Vital Sign    Worried About Running Out of Food in the Last Year: Never true    Ran Out of Food in the Last Year: Never true  Transportation Needs: No Transportation Needs (04/03/2021)   PRAPARE - Administrator, Civil Service (Medical): No    Lack of Transportation (Non-Medical): No  Physical Activity: Unknown (04/03/2021)   Exercise Vital Sign    Days of Exercise per Week: 7 days    Minutes of Exercise per Session: Not on file  Stress: No Stress Concern Present (04/03/2021)   [employer] of Occupational Health - Occupational Stress Questionnaire    Feeling of Stress : Not at all  Social Connections: Moderately Isolated (04/03/2021)   Social Connection and Isolation Panel [NHANES]    Frequency of Communication with Friends and Family: More than three times a week    Frequency of Social Gatherings with Friends and Family: Never    Attends Religious Services: 1 to 4 times per year    Active Member of [employer] or Organizations: No    Attends Engineer, structural: Never    Marital Status: Divorced    Tobacco Counseling Ready to quit: Not Answered Counseling given: Not Answered Tobacco comments: 1/2PPD   Clinical Intake:  Pre-visit preparation completed: Yes  Pain : No/denies pain     Nutritional Status: BMI of 19-24  Normal Diabetes: No  How often do you need to have someone help you when you read instructions, pamphlets, or other written materials from your doctor or pharmacy?: 1 - Never What is the last grade level you completed in school?: 10th grade  Diabetic?No    Activities of Daily Living    04/07/2022    3:47 PM  In your present state of health, do you have any difficulty performing the following activities:  Hearing? 0  Vision? 0  Difficulty concentrating or making decisions? 0  Walking or climbing stairs? 0   Dressing or bathing? 0  Doing errands, shopping? 0    Patient Care Team: Anabel Halon, MD as PCP - General (Internal Medicine) Synetta Fail, MD (Internal Medicine)  Indicate any recent Medical Services you may have received from other than [facility] providers in the past year (date may be approximate).     Assessment:   This is a routine wellness examination for Warren Patrick.  Hearing/Vision screen No results found.  Dietary issues and exercise activities discussed:     Goals Addressed   None    Depression Screen    04/07/2022    3:47 PM 10/07/2021    1:18 PM 04/03/2021    4:30 PM 02/12/2021    2:47 PM 04/13/2016    4:27 PM 01/27/2016    3:31 PM 08/26/2015    1:23 PM  PHQ 2/9 Scores  PHQ - 2 Score 0 0 0 0 5 0 0  PHQ- 9 Score     18  7    Fall Risk    04/07/2022    3:46 PM 10/07/2021    1:18 PM 04/03/2021    4:34 PM 02/12/2021    2:47 PM 01/27/2016    3:31 PM  Fall Risk   Falls in the past year? 1 1 1  0 Yes  Number falls in past yr: 0 1 0 0 2 or more  Injury with Fall? 0 0 0 0   Risk Factor Category      High Fall Risk  Risk for fall due to :  Impaired balance/gait  No Fall Risks History of fall(s)  Follow up  Falls evaluation completed Falls evaluation completed Falls evaluation completed     FALL RISK PREVENTION PERTAINING TO THE HOME:  Any stairs in or around the home? No  If so, are there any without handrails? No  Home free of loose throw rugs in walkways, pet beds, electrical cords, etc? Yes  Adequate lighting in your home to reduce risk of falls? Yes   ASSISTIVE DEVICES UTILIZED TO PREVENT FALLS:  Life alert? No  Use of a cane, walker or w/c? No  Grab bars in the bathroom? No  Shower chair or bench in shower? No  Elevated toilet seat or a handicapped toilet? No     Cognitive Function:        04/07/2022    3:47 PM 04/03/2021    4:36 PM 04/03/2021    4:34 PM  6CIT Screen  What Year? 0 points  0 points  What month? 0 points  0 points  What  time? 0 points 0 points   Count back from 20 0 points 0 points   Months in reverse 0 points 0 points   Repeat phrase 0 points 2 points   Total Score 0 points      Immunizations Immunization History  Administered Date(s) Administered   Influenza,inj,Quad PF,6+ Mos 04/13/2016, 01/26/2019, 12/28/2019   PNEUMOCOCCAL CONJUGATE-20 10/07/2021   Pneumococcal Polysaccharide-23 05/24/2018   Td 02/18/2011, 11/27/2015    TDAP status: Up to date  Flu Vaccine status: Due, Education has been provided regarding the importance of this vaccine. Advised may receive this vaccine at local pharmacy or Health Dept. Aware to provide a copy of the vaccination record if obtained from local pharmacy or Health Dept. Verbalized acceptance and understanding.Reports having it done at our office.  Pneumococcal vaccine status: Up to date  Covid-19 vaccine status: Declined, Education has been provided regarding the importance of this vaccine but patient still declined. Advised may receive this vaccine at local pharmacy or Health Dept.or vaccine clinic. Aware to provide a copy of the vaccination record if obtained from local pharmacy or Health Dept. Verbalized acceptance and understanding.  Qualifies for Shingles Vaccine? Yes   Zostavax completed No   Shingrix Completed?: No.    Education has been provided regarding the importance of this vaccine. Patient has been advised to call insurance company to determine out of pocket expense  if they have not yet received this vaccine. Advised may also receive vaccine at local pharmacy or Health Dept. Verbalized acceptance and understanding.  Screening Tests Health Maintenance  Topic Date Due   COVID-19 Vaccine (1) Never done   COLONOSCOPY (Pts 45-3yrs Insurance coverage will need to be confirmed)  Never done   Lung Cancer Screening  Never done   Zoster Vaccines- Shingrix (1 of 2) Never done   COLON CANCER SCREENING ANNUAL FOBT  05/30/2016   INFLUENZA VACCINE  10/14/2021    Medicare Annual Wellness (AWV)  04/03/2022   DTaP/Tdap/Td (3 - Tdap) 11/26/2025   Pneumonia Vaccine 24+ Years old  Completed   Hepatitis C Screening  Completed   HPV VACCINES  Aged Out    Health Maintenance  Health Maintenance Due  Topic Date Due   COVID-19 Vaccine (1) Never done   COLONOSCOPY (Pts 45-50yrs Insurance coverage will need to be confirmed)  Never done   Lung Cancer Screening  Never done   Zoster Vaccines- Shingrix (1 of 2) Never done   COLON CANCER SCREENING ANNUAL FOBT  05/30/2016   INFLUENZA VACCINE  10/14/2021   Medicare Annual Wellness (AWV)  04/03/2022    Colorectal cancer screening: Type of screening: Cologuard. Patient received last week from [employer].   Lung Cancer Screening: (Low Dose CT Chest recommended if Age 40-80 years, 30 pack-year currently smoking OR have quit w/in 15years.) does qualify.  Lung Cancer Screening Referral: He would like to wait at this time on ordering.   Additional Screening:  Hepatitis C Screening: does not qualify; Completed 05/28/2018  Vision Screening: Recommended annual ophthalmology exams for early detection of glaucoma and other disorders of the eye. Is the patient up to date with their annual eye exam?  No  Who is the provider or what is the name of the office in which the patient attends annual eye exams? He has plans to go to [facility] If pt is not established with a provider, would they like to be referred to a provider to establish care? No .   Dental Screening: Recommended annual dental exams for proper oral hygiene  Community Resource Referral / Chronic Care Management: CRR required this visit?  No   CCM required this visit?  No      Plan:     I have personally reviewed and noted the following in the patient's chart:   Medical and social history Use of alcohol, tobacco or illicit drugs  Current medications and supplements including opioid prescriptions. Patient is not currently taking opioid  prescriptions. Functional ability and status Nutritional status Physical activity Advanced directives List of other physicians Hospitalizations, surgeries, and ER visits in previous 12 months Vitals Screenings to include cognitive, depression, and falls Referrals and appointments  In addition, I have reviewed and discussed with patient certain preventive protocols, quality metrics, and best practice recommendations. A written personalized care plan for preventive services as well as general preventive health recommendations were provided to patient.     Warren Dy, MD   04/07/2022

## 2022-04-07 NOTE — Patient Instructions (Signed)
  Warren Patrick , Thank you for taking time to come for your Medicare Wellness Visit. I appreciate your ongoing commitment to your health goals. Please review the following plan we discussed and let me know if I can assist you in the future.   These are the goals we discussed: He is going to stay active and continue to cut back on tobacco use.   This is a list of the screening recommended for you and due dates:  Health Maintenance  Topic Date Due   COVID-19 Vaccine (1) Never done   Colon Cancer Screening  Never done   Screening for Lung Cancer  Never done   Zoster (Shingles) Vaccine (1 of 2) Never done   Stool Blood Test  05/30/2016   Flu Shot  10/14/2021   Medicare Annual Wellness Visit  04/03/2022   DTaP/Tdap/Td vaccine (3 - Tdap) 11/26/2025   Pneumonia Vaccine  Completed   Hepatitis C Screening: USPSTF Recommendation to screen - Ages 18-79 yo.  Completed   HPV Vaccine  Aged Out

## 2022-04-18 ENCOUNTER — Other Ambulatory Visit: Payer: Self-pay | Admitting: Internal Medicine

## 2022-04-28 ENCOUNTER — Encounter: Payer: 59 | Admitting: Internal Medicine

## 2022-05-04 ENCOUNTER — Encounter: Payer: Self-pay | Admitting: Internal Medicine

## 2022-10-21 ENCOUNTER — Ambulatory Visit: Payer: 59 | Admitting: Internal Medicine

## 2022-10-23 ENCOUNTER — Encounter: Payer: Self-pay | Admitting: Internal Medicine

## 2022-11-06 ENCOUNTER — Emergency Department (HOSPITAL_COMMUNITY)
Admission: EM | Admit: 2022-11-06 | Discharge: 2022-11-07 | Disposition: A | Discharge status: Home / Self Care | Payer: 59 | Attending: Emergency Medicine | Admitting: Emergency Medicine

## 2022-11-06 ENCOUNTER — Encounter (HOSPITAL_COMMUNITY): Payer: Self-pay | Admitting: Emergency Medicine

## 2022-11-06 ENCOUNTER — Emergency Department (HOSPITAL_COMMUNITY): Payer: 59

## 2022-11-06 ENCOUNTER — Other Ambulatory Visit: Payer: Self-pay

## 2022-11-06 DIAGNOSIS — R935 Abnormal findings on diagnostic imaging of other abdominal regions, including retroperitoneum: Secondary | ICD-10-CM | POA: Diagnosis not present

## 2022-11-06 DIAGNOSIS — S4991XA Unspecified injury of right shoulder and upper arm, initial encounter: Secondary | ICD-10-CM | POA: Diagnosis present

## 2022-11-06 DIAGNOSIS — S22019A Unspecified fracture of first thoracic vertebra, initial encounter for closed fracture: Secondary | ICD-10-CM | POA: Insufficient documentation

## 2022-11-06 DIAGNOSIS — Z043 Encounter for examination and observation following other accident: Secondary | ICD-10-CM | POA: Diagnosis not present

## 2022-11-06 DIAGNOSIS — D7389 Other diseases of spleen: Secondary | ICD-10-CM | POA: Insufficient documentation

## 2022-11-06 DIAGNOSIS — S129XXA Fracture of neck, unspecified, initial encounter: Secondary | ICD-10-CM | POA: Diagnosis not present

## 2022-11-06 DIAGNOSIS — W1839XA Other fall on same level, initial encounter: Secondary | ICD-10-CM | POA: Diagnosis not present

## 2022-11-06 DIAGNOSIS — S42031A Displaced fracture of lateral end of right clavicle, initial encounter for closed fracture: Secondary | ICD-10-CM | POA: Insufficient documentation

## 2022-11-06 DIAGNOSIS — S12600A Unspecified displaced fracture of seventh cervical vertebra, initial encounter for closed fracture: Secondary | ICD-10-CM | POA: Insufficient documentation

## 2022-11-06 DIAGNOSIS — I700 Atherosclerosis of aorta: Secondary | ICD-10-CM | POA: Insufficient documentation

## 2022-11-06 DIAGNOSIS — S42121A Displaced fracture of acromial process, right shoulder, initial encounter for closed fracture: Secondary | ICD-10-CM | POA: Diagnosis not present

## 2022-11-06 DIAGNOSIS — S42001A Fracture of unspecified part of right clavicle, initial encounter for closed fracture: Secondary | ICD-10-CM | POA: Diagnosis not present

## 2022-11-06 DIAGNOSIS — S2231XA Fracture of one rib, right side, initial encounter for closed fracture: Secondary | ICD-10-CM | POA: Insufficient documentation

## 2022-11-06 DIAGNOSIS — D72829 Elevated white blood cell count, unspecified: Secondary | ICD-10-CM | POA: Insufficient documentation

## 2022-11-06 DIAGNOSIS — I10 Essential (primary) hypertension: Secondary | ICD-10-CM | POA: Diagnosis not present

## 2022-11-06 DIAGNOSIS — M542 Cervicalgia: Secondary | ICD-10-CM | POA: Diagnosis not present

## 2022-11-06 DIAGNOSIS — T07XXXA Unspecified multiple injuries, initial encounter: Secondary | ICD-10-CM | POA: Diagnosis not present

## 2022-11-06 DIAGNOSIS — N400 Enlarged prostate without lower urinary tract symptoms: Secondary | ICD-10-CM | POA: Insufficient documentation

## 2022-11-06 DIAGNOSIS — W19XXXA Unspecified fall, initial encounter: Secondary | ICD-10-CM | POA: Diagnosis not present

## 2022-11-06 DIAGNOSIS — N3289 Other specified disorders of bladder: Secondary | ICD-10-CM | POA: Diagnosis not present

## 2022-11-06 DIAGNOSIS — Z743 Need for continuous supervision: Secondary | ICD-10-CM | POA: Diagnosis not present

## 2022-11-06 MED ORDER — FENTANYL CITRATE PF 50 MCG/ML IJ SOSY
50.0000 ug | PREFILLED_SYRINGE | Freq: Once | INTRAMUSCULAR | Status: AC
  Administered 2022-11-06: 50 ug via INTRAVENOUS
  Filled 2022-11-06: qty 1

## 2022-11-06 MED ORDER — ONDANSETRON HCL 4 MG/2ML IJ SOLN
4.0000 mg | Freq: Once | INTRAMUSCULAR | Status: AC
  Administered 2022-11-06: 4 mg via INTRAVENOUS
  Filled 2022-11-06: qty 2

## 2022-11-06 MED ORDER — MORPHINE SULFATE (PF) 4 MG/ML IV SOLN
4.0000 mg | Freq: Once | INTRAVENOUS | Status: AC
  Administered 2022-11-06: 4 mg via INTRAVENOUS
  Filled 2022-11-06: qty 1
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
## 2022-11-07 ENCOUNTER — Emergency Department (HOSPITAL_COMMUNITY): Payer: 59

## 2022-11-07 ENCOUNTER — Telehealth (HOSPITAL_COMMUNITY): Payer: Self-pay | Admitting: Emergency Medicine

## 2022-11-07 DIAGNOSIS — R935 Abnormal findings on diagnostic imaging of other abdominal regions, including retroperitoneum: Secondary | ICD-10-CM | POA: Diagnosis not present

## 2022-11-07 DIAGNOSIS — Z043 Encounter for examination and observation following other accident: Secondary | ICD-10-CM | POA: Diagnosis not present

## 2022-11-07 DIAGNOSIS — N3289 Other specified disorders of bladder: Secondary | ICD-10-CM | POA: Diagnosis not present

## 2022-11-07 DIAGNOSIS — I700 Atherosclerosis of aorta: Secondary | ICD-10-CM | POA: Diagnosis not present

## 2022-11-07 DIAGNOSIS — S42031A Displaced fracture of lateral end of right clavicle, initial encounter for closed fracture: Secondary | ICD-10-CM | POA: Diagnosis not present

## 2022-11-07 LAB — BASIC METABOLIC PANEL
Anion gap: 13 (ref 5–15)
BUN: 10 mg/dL (ref 8–23)
CO2: 24 mmol/L (ref 22–32)
Calcium: 8.9 mg/dL (ref 8.9–10.3)
Chloride: 99 mmol/L (ref 98–111)
Creatinine, Ser: 0.9 mg/dL (ref 0.61–1.24)
GFR, Estimated: >60 mL/min (ref >60)
Glucose, Bld: 104 mg/dL — ABNORMAL HIGH (ref 70–99)
Potassium: 3.8 mmol/L (ref 3.5–5.1)
Sodium: 136 mmol/L (ref 135–145)

## 2022-11-07 LAB — CBC
HCT: 46.2 % (ref 39.0–52.0)
Hemoglobin: 15.9 g/dL (ref 13.0–17.0)
MCH: 32.4 pg (ref 26.0–34.0)
MCHC: 34.4 g/dL (ref 30.0–36.0)
MCV: 94.3 fL (ref 80.0–100.0)
Platelets: 250 10*3/uL (ref 150–400)
RBC: 4.9 MIL/uL (ref 4.22–5.81)
RDW: 13.1 % (ref 11.5–15.5)
WBC: 13.4 10*3/uL — ABNORMAL HIGH (ref 4.0–10.5)
nRBC: 0 % (ref 0.0–0.2)

## 2022-11-07 MED ORDER — IOHEXOL 300 MG/ML  SOLN
100.0000 mL | Freq: Once | INTRAMUSCULAR | Status: AC | PRN
  Administered 2022-11-07: 100 mL via INTRAVENOUS

## 2022-11-07 MED ORDER — HYDROCODONE-ACETAMINOPHEN 5-325 MG PO TABS: 1.0000 | ORAL_TABLET | Freq: Four times a day (QID) | ORAL | 0 refills | Status: DC | PRN

## 2022-11-07 MED ORDER — IOHEXOL 350 MG/ML SOLN
60.0000 mL | Freq: Once | INTRAVENOUS | Status: AC | PRN
  Administered 2022-11-07: 60 mL via INTRAVENOUS

## 2022-11-07 MED ORDER — HYDROCODONE-ACETAMINOPHEN 5-325 MG PO TABS
2.0000 | ORAL_TABLET | Freq: Once | ORAL | Status: AC
  Administered 2022-11-07: 2 via ORAL
  Filled 2022-11-07: qty 2

## 2022-11-07 NOTE — ED Notes (Signed)
Pt given microwave dinner and drink.

## 2022-11-07 NOTE — ED Notes (Signed)
Patient transported to CT 
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
## 2022-11-07 NOTE — Telephone Encounter (Signed)
[facility] is out of the patient's medication, he was prescribed 5 mg of hydrocodone, 10 tablets last night, I have resent this to the [facility] at the patient's request.

## 2022-11-18 ENCOUNTER — Telehealth: Payer: Self-pay

## 2022-11-18 NOTE — Telephone Encounter (Signed)
Transition Care Management Unsuccessful Follow-up Telephone Call  Date of discharge and from where:  11/07/2022 [facility]  Attempts:  1st Attempt  Reason for unsuccessful TCM follow-up call:  Missing or invalid number  Birl Lobello [facility]  [facility] Population Health Community Resource Care Guide   [email or web address]  ?? [phone]   Website: [email or web address]  [email or web address]

## 2022-12-18 ENCOUNTER — Other Ambulatory Visit: Payer: Self-pay | Admitting: Internal Medicine

## 2022-12-18 DIAGNOSIS — Z1212 Encounter for screening for malignant neoplasm of rectum: Secondary | ICD-10-CM

## 2022-12-18 DIAGNOSIS — Z1211 Encounter for screening for malignant neoplasm of colon: Secondary | ICD-10-CM

## 2023-02-18 ENCOUNTER — Ambulatory Visit (INDEPENDENT_AMBULATORY_CARE_PROVIDER_SITE_OTHER): Payer: 59 | Admitting: Internal Medicine

## 2023-02-18 ENCOUNTER — Encounter: Payer: Self-pay | Admitting: Internal Medicine

## 2023-02-18 VITALS — BP 144/76 | HR 82 | Ht 69.0 in | Wt 126.2 lb

## 2023-02-18 DIAGNOSIS — E782 Mixed hyperlipidemia: Secondary | ICD-10-CM | POA: Diagnosis not present

## 2023-02-18 DIAGNOSIS — R350 Frequency of micturition: Secondary | ICD-10-CM

## 2023-02-18 DIAGNOSIS — Z23 Encounter for immunization: Secondary | ICD-10-CM | POA: Diagnosis not present

## 2023-02-18 DIAGNOSIS — I10 Essential (primary) hypertension: Secondary | ICD-10-CM | POA: Diagnosis not present

## 2023-02-18 DIAGNOSIS — K219 Gastro-esophageal reflux disease without esophagitis: Secondary | ICD-10-CM

## 2023-02-18 DIAGNOSIS — Z720 Tobacco use: Secondary | ICD-10-CM | POA: Diagnosis not present

## 2023-02-18 DIAGNOSIS — R739 Hyperglycemia, unspecified: Secondary | ICD-10-CM

## 2023-02-18 DIAGNOSIS — N401 Enlarged prostate with lower urinary tract symptoms: Secondary | ICD-10-CM

## 2023-02-18 DIAGNOSIS — F1024 Alcohol dependence with alcohol-induced mood disorder: Secondary | ICD-10-CM

## 2023-02-18 DIAGNOSIS — Z0001 Encounter for general adult medical examination with abnormal findings: Secondary | ICD-10-CM | POA: Diagnosis not present

## 2023-02-18 MED ORDER — OMEPRAZOLE 20 MG PO CPDR
DELAYED_RELEASE_CAPSULE | ORAL | 1 refills | Status: DC
Start: 2023-02-18 — End: 2023-05-13

## 2023-02-18 MED ORDER — AMLODIPINE BESYLATE 5 MG PO TABS
5.0000 mg | ORAL_TABLET | Freq: Every day | ORAL | 1 refills | Status: DC
Start: 2023-02-18 — End: 2023-06-01

## 2023-02-18 MED ORDER — ALFUZOSIN HCL ER 10 MG PO TB24
10.0000 mg | ORAL_TABLET | Freq: Every day | ORAL | 3 refills | Status: DC
Start: 2023-02-18 — End: 2023-05-13

## 2023-02-18 NOTE — Assessment & Plan Note (Signed)
Physical exam as documented. Fasting blood tests today. Advised to get Shingrix vaccines at local pharmacy. Has Cologaurd, needs to submit sample.

## 2023-02-18 NOTE — Assessment & Plan Note (Signed)
He takes 2-3 alcoholic drinks per day He reports insomnia without alcohol, has tried providing medication for insomnia, but did not like them Advised to avoid more than 2 alcoholic drinks in a day Check CMP
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
## 2023-02-18 NOTE — Patient Instructions (Addendum)
Please start taking Amlodipine as prescribed for blood pressure.  Please start taking Alfuzosin for prostate.  Please cut down -> quit smoking. Please avoid more than 2 alcoholic beverages in a day.  Continue to follow low salt diet and ambulate as tolerated.

## 2023-02-18 NOTE — Assessment & Plan Note (Signed)
BP Readings from Last 1 Encounters:  02/18/23 (!) 144/76   Uncontrolled Restarted Amlodipine 5 mg QD Advised DASH diet and moderate exercise/walking, at least 150 mins/week

## 2023-02-18 NOTE — Assessment & Plan Note (Addendum)
He has overflow incontinence due to BPH Had diarrhea with tamsulosin Switched to Uroxatral If persistent, will refer to urology CT abdomen pelvis showed prostatomegaly during ER visit Check PSA

## 2023-02-18 NOTE — Assessment & Plan Note (Addendum)
Smokes about 0.5 pack/day, used to smoke 1 pack/day, started at age 67  Asked about quitting: confirms that he currently smokes cigarettes Advise to quit smoking: Educated about QUITTING to reduce the risk of cancer, cardio and cerebrovascular disease. Assess willingness: Unwilling to quit at this time, but is working on cutting back. Assist with counseling and pharmacotherapy: Counseled for 5 minutes and literature provided. Arrange for follow up: follow up in 3 months and continue to offer help.

## 2023-02-18 NOTE — Assessment & Plan Note (Signed)
Admits eating hot and spicy hotdogs Was on Omeprazole 20 mg once daily - refilled Needs to avoid hot and spicy food, and cut down alcohol and tobacco use

## 2023-02-19 LAB — CMP14+EGFR
ALT: 13 [IU]/L (ref 0–44)
AST: 20 [IU]/L (ref 0–40)
Albumin: 4.5 g/dL (ref 3.9–4.9)
Alkaline Phosphatase: 61 [IU]/L (ref 44–121)
BUN/Creatinine Ratio: 9 — ABNORMAL LOW (ref 10–24)
BUN: 10 mg/dL (ref 8–27)
Bilirubin Total: 0.9 mg/dL (ref 0.0–1.2)
CO2: 25 mmol/L (ref 20–29)
Calcium: 9.5 mg/dL (ref 8.6–10.2)
Chloride: 98 mmol/L (ref 96–106)
Creatinine, Ser: 1.06 mg/dL (ref 0.76–1.27)
Globulin, Total: 2.6 g/dL (ref 1.5–4.5)
Glucose: 77 mg/dL (ref 70–99)
Potassium: 4.4 mmol/L (ref 3.5–5.2)
Sodium: 138 mmol/L (ref 134–144)
Total Protein: 7.1 g/dL (ref 6.0–8.5)
eGFR: 76 mL/min/{1.73_m2} (ref >59)

## 2023-02-19 LAB — CBC WITH DIFFERENTIAL/PLATELET
Basophils Absolute: 0 10*3/uL (ref 0.0–0.2)
Basos: 1 %
EOS (ABSOLUTE): 0.1 10*3/uL (ref 0.0–0.4)
Eos: 1 %
Hematocrit: 47 % (ref 37.5–51.0)
Hemoglobin: 16.1 g/dL (ref 13.0–17.7)
Immature Grans (Abs): 0.1 10*3/uL (ref 0.0–0.1)
Immature Granulocytes: 1 %
Lymphocytes Absolute: 0.9 10*3/uL (ref 0.7–3.1)
Lymphs: 13 %
MCH: 32.5 pg (ref 26.6–33.0)
MCHC: 34.3 g/dL (ref 31.5–35.7)
MCV: 95 fL (ref 79–97)
Monocytes Absolute: 0.7 10*3/uL (ref 0.1–0.9)
Monocytes: 10 %
Neutrophils Absolute: 4.8 10*3/uL (ref 1.4–7.0)
Neutrophils: 74 %
Platelets: 258 10*3/uL (ref 150–450)
RBC: 4.96 x10E6/uL (ref 4.14–5.80)
RDW: 12.6 % (ref 11.6–15.4)
WBC: 6.5 10*3/uL (ref 3.4–10.8)

## 2023-02-19 LAB — LIPID PANEL
Chol/HDL Ratio: 2.6 {ratio} (ref 0.0–5.0)
Cholesterol, Total: 161 mg/dL (ref 100–199)
HDL: 61 mg/dL (ref >39)
LDL Chol Calc (NIH): 87 mg/dL (ref 0–99)
Triglycerides: 64 mg/dL (ref 0–149)
VLDL Cholesterol Cal: 13 mg/dL (ref 5–40)

## 2023-02-19 LAB — HEMOGLOBIN A1C
Est. average glucose Bld gHb Est-mCnc: 103 mg/dL
Hgb A1c MFr Bld: 5.2 % (ref 4.8–5.6)

## 2023-02-19 LAB — TSH: TSH: 0.842 u[IU]/mL (ref 0.450–4.500)

## 2023-02-19 LAB — PSA: Prostate Specific Ag, Serum: 5.5 ng/mL — ABNORMAL HIGH (ref 0.0–4.0)

## 2023-02-22 ENCOUNTER — Encounter: Payer: Self-pay | Admitting: Internal Medicine

## 2023-03-24 ENCOUNTER — Ambulatory Visit (INDEPENDENT_AMBULATORY_CARE_PROVIDER_SITE_OTHER): Payer: 59

## 2023-03-24 VITALS — Ht 69.0 in | Wt 126.5 lb

## 2023-03-24 DIAGNOSIS — Z0100 Encounter for examination of eyes and vision without abnormal findings: Secondary | ICD-10-CM

## 2023-03-24 DIAGNOSIS — Z0000 Encounter for general adult medical examination without abnormal findings: Secondary | ICD-10-CM | POA: Diagnosis not present

## 2023-03-24 NOTE — Patient Instructions (Signed)
 Warren Patrick , Thank you for taking time to come for your Medicare Wellness Visit. I appreciate your ongoing commitment to your health goals. Please review the following plan we discussed and let me know if I can assist you in the future.   Referrals/Orders/Follow-Ups/Clinician Recommendations:  Next Medicare Annual Wellness Visit: March 27, 2024 at 1:50 pm virtual visit  You've been referred to see Dr. Adine Haddock for an eye exam. If you haven't heard from his office in about a week, please call to schedule your appointment.   Adine Haddock, MD [address] [place] [address]  Phone: [phone]   This is a list of the screening recommended for you and due dates:  Health Maintenance  Topic Date Due   Cologuard (Stool DNA test)  Never done   Zoster (Shingles) Vaccine (1 of 2) Never done   Stool Blood Test  05/30/2016   COVID-19 Vaccine (1 - 2024-25 season) Never done   Screening for Lung Cancer  11/07/2023   Medicare Annual Wellness Visit  03/23/2024   DTaP/Tdap/Td vaccine (3 - Tdap) 11/26/2025   Pneumonia Vaccine  Completed   Flu Shot  Completed   Hepatitis C Screening  Completed   HPV Vaccine  Aged Out    Advanced directives: (Declined) Advance directive discussed with you today. Even though you declined this today, please call our office should you change your mind, and we can give you the proper paperwork for you to fill out.  Next Medicare Annual Wellness Visit scheduled for next year: Yes  Preventive Care 80 Years and Older, Male Preventive care refers to lifestyle choices and visits with your health care provider that can promote health and wellness. Preventive care visits are also called wellness exams. What can I expect for my preventive care visit? Counseling During your preventive care visit, your health care provider may ask about your: Medical history, including: Past medical problems. Family medical history. History of falls. Current health,  including: Emotional well-being. Home life and relationship well-being. Sexual activity. Memory and ability to understand (cognition). Lifestyle, including: Alcohol, nicotine  or tobacco, and drug use. Access to firearms. Diet, exercise, and sleep habits. Work and work astronomer. Sunscreen use. Safety issues such as seatbelt and bike helmet use. Physical exam Your health care provider will check your: Height and weight. These may be used to calculate your BMI (body mass index). BMI is a measurement that tells if you are at a healthy weight. Waist circumference. This measures the distance around your waistline. This measurement also tells if you are at a healthy weight and may help predict your risk of certain diseases, such as type 2 diabetes and high blood pressure. Heart rate and blood pressure. Body temperature. Skin for abnormal spots. What immunizations do I need?  Vaccines are usually given at various ages, according to a schedule. Your health care provider will recommend vaccines for you based on your age, medical history, and lifestyle or other factors, such as travel or where you work. What tests do I need? Screening Your health care provider may recommend screening tests for certain conditions. This may include: Lipid and cholesterol levels. Diabetes screening. This is done by checking your blood sugar (glucose) after you have not eaten for a while (fasting). Hepatitis C test. Hepatitis B test. HIV (human immunodeficiency virus) test. STI (sexually transmitted infection) testing, if you are at risk. Lung cancer screening. Colorectal cancer screening. Prostate cancer screening. Abdominal aortic aneurysm (AAA) screening. You may need this if  you are a current or former smoker. Talk with your health care provider about your test results, treatment options, and if necessary, the need for more tests. Follow these instructions at home: Eating and drinking  Eat a diet that  includes fresh fruits and vegetables, whole grains, lean protein, and low-fat dairy products. Limit your intake of foods with high amounts of sugar, saturated fats, and salt. Take vitamin and mineral supplements as recommended by your health care provider. Do not drink alcohol if your health care provider tells you not to drink. If you drink alcohol: Limit how much you have to 0-2 drinks a day. Know how much alcohol is in your drink. In the [place] one drink equals one 12 oz bottle of beer (355 mL), one 5 oz glass of wine (148 mL), or one 1 oz glass of hard liquor (44 mL). Lifestyle Brush your teeth every morning and night with fluoride toothpaste. Floss one time each day. Exercise for at least 30 minutes 5 or more days each week. Do not use any products that contain nicotine  or tobacco. These products include cigarettes, chewing tobacco, and vaping devices, such as e-cigarettes. If you need help quitting, ask your health care provider. Do not use drugs. If you are sexually active, practice safe sex. Use a condom or other form of protection to prevent STIs. Take aspirin  only as told by your health care provider. Make sure that you understand how much to take and what form to take. Work with your health care provider to find out whether it is safe and beneficial for you to take aspirin  daily. Ask your health care provider if you need to take a cholesterol-lowering medicine (statin). Find healthy ways to manage stress, such as: Meditation, yoga, or listening to music. Journaling. Talking to a trusted person. Spending time with friends and family. Safety Always wear your seat belt while driving or riding in a vehicle. Do not drive: If you have been drinking alcohol. Do not ride with someone who has been drinking. When you are tired or distracted. While texting. If you have been using any mind-altering substances or drugs. Wear a helmet and other protective equipment during sports  activities. If you have firearms in your house, make sure you follow all gun safety procedures. Minimize exposure to UV radiation to reduce your risk of skin cancer. What's next? Visit your health care provider once a year for an annual wellness visit. Ask your health care provider how often you should have your eyes and teeth checked. Stay up to date on all vaccines. This information is not intended to replace advice given to you by your health care provider. Make sure you discuss any questions you have with your health care provider. Document Revised: 08/28/2020 Document Reviewed: 08/28/2020 Elsevier Patient Education  2024 [employer]. Understanding Your Risk for Falls Millions of people have serious injuries from falls each year. It is important to understand your risk of falling. Talk with your health care provider about your risk and what you can do to lower it. If you do have a serious fall, make sure to tell your provider. Falling once raises your risk of falling again. How can falls affect me? Serious injuries from falls are common. These include: Broken bones, such as hip fractures. Head injuries, such as traumatic brain injuries (TBI) or concussions. A fear of falling can cause you to avoid activities and stay at home. This can make your muscles weaker and raise your risk for a fall.  What can increase my risk? There are a number of risk factors that increase your risk for falling. The more risk factors you have, the higher your risk of falling. Serious injuries from a fall happen most often to people who are older than 69 years old. Teenagers and young adults ages 3-29 are also at higher risk. Common risk factors include: Weakness in the lower body. Being generally weak or confused due to long-term (chronic) illness. Dizziness or balance problems. Poor vision. Medicines that cause dizziness or drowsiness. These may include: Medicines for your blood pressure, heart, anxiety,  insomnia, or swelling (edema). Pain medicines. Muscle relaxants. Other risk factors include: Drinking alcohol. Having had a fall in the past. Having foot pain or wearing improper footwear. Working at a dangerous job. Having any of the following in your home: Tripping hazards, such as floor clutter or loose rugs. Poor lighting. Pets. Having dementia or memory loss. What actions can I take to lower my risk of falling?     Physical activity Stay physically fit. Do strength and balance exercises. Consider taking a regular class to build strength and balance. Yoga and tai chi are good options. Vision Have your eyes checked every year and your prescription for glasses or contacts updated as needed. Shoes and walking aids Wear non-skid shoes. Wear shoes that have rubber soles and low heels. Do not wear high heels. Do not walk around the house in socks or slippers. Use a cane or walker as told by your provider. Home safety Attach secure railings on both sides of your stairs. Install grab bars for your bathtub, shower, and toilet. Use a non-skid mat in your bathtub or shower. Attach bath mats securely with double-sided, non-slip rug tape. Use good lighting in all rooms. Keep a flashlight near your bed. Make sure there is a clear path from your bed to the bathroom. Use night-lights. Do not use throw rugs. Make sure all carpeting is taped or tacked down securely. Remove all clutter from walkways and stairways, including extension cords. Repair uneven or broken steps and floors. Avoid walking on icy or slippery surfaces. Walk on the grass instead of on icy or slick sidewalks. Use ice melter to get rid of ice on walkways in the winter. Use a cordless phone. Questions to ask your health care provider Can you help me check my risk for a fall? Do any of my medicines make me more likely to fall? Should I take a vitamin D  supplement? What exercises can I do to improve my strength and  balance? Should I make an appointment to have my vision checked? Do I need a bone density test to check for weak bones (osteoporosis)? Would it help to use a cane or a walker? Where to find more information Centers for Disease Control and Prevention, STEADI: tonerpromos.no Community-Based Fall Prevention Programs: tonerpromos.no [employer] on Aging: baseringtones.pl Contact a health care provider if: You fall at home. You are afraid of falling at home. You feel weak, drowsy, or dizzy. This information is not intended to replace advice given to you by your health care provider. Make sure you discuss any questions you have with your health care provider. Document Revised: 11/03/2021 Document Reviewed: 11/03/2021 Elsevier Patient Education  2024 [employer] Steps to Quit Smoking Smoking tobacco is the leading cause of preventable death. It can affect almost every organ in the body. Smoking puts you and people around you at risk for many serious, long-lasting (chronic) diseases. Quitting smoking can be hard,  but it is one of the best things that you can do for your health. It is never too late to quit. Do not give up if you cannot quit the first time. Some people need to try many times to quit. Do your best to stick to your quit plan, and talk with your doctor if you have any questions or concerns. How do I get ready to quit? Pick a date to quit. Set a date within the next 2 weeks to give you time to prepare. Write down the reasons why you are quitting. Keep this list in places where you will see it often. Tell your family, friends, and co-workers that you are quitting. Their support is important. Talk with your doctor about the choices that may help you quit. Find out if your health insurance will pay for these treatments. Know the people, places, things, and activities that make you want to smoke (triggers). Avoid them. What first steps can I take to quit smoking? Throw away all cigarettes at home,  at work, and in your car. Throw away the things that you use when you smoke, such as ashtrays and lighters. Clean your car. Empty the ashtray. Clean your home, including curtains and carpets. What can I do to help me quit smoking? Talk with your doctor about taking medicines and seeing a counselor. You are more likely to succeed when you do both. If you are pregnant or breastfeeding: Talk with your doctor about counseling or other ways to quit smoking. Do not take medicine to help you quit smoking unless your doctor tells you to. Quit right away Quit smoking completely, instead of slowly cutting back on how much you smoke over a period of time. Stopping smoking right away may be more successful than slowly quitting. Go to counseling. In-person is best if this is an option. You are more likely to quit if you go to counseling sessions regularly. Take medicine You may take medicines to help you quit. Some medicines need a prescription, and some you can buy over-the-counter. Some medicines may contain a drug called nicotine  to replace the nicotine  in cigarettes. Medicines may: Help you stop having the desire to smoke (cravings). Help to stop the problems that come when you stop smoking (withdrawal symptoms). Your doctor may ask you to use: Nicotine  patches, gum, or lozenges. Nicotine  inhalers or sprays. Non-nicotine  medicine that you take by mouth. Find resources Find resources and other ways to help you quit smoking and remain smoke-free after you quit. They include: Online chats with a veterinary surgeon. Phone quitlines. Printed materials engineer. Support groups or group counseling. Text messaging programs. Mobile phone apps. Use apps on your mobile phone or tablet that can help you stick to your quit plan. Examples of free services include Quit Guide from the CDC and smokefree.gov  What can I do to make it easier to quit?  Talk to your family and friends. Ask them to support and encourage  you. Call a phone quitline, such as 1-800-QUIT-NOW, reach out to support groups, or work with a veterinary surgeon. Ask people who smoke to not smoke around you. Avoid places that make you want to smoke, such as: Bars. Parties. Smoke-break areas at work. Spend time with people who do not smoke. Lower the stress in your life. Stress can make you want to smoke. Try these things to lower stress: Getting regular exercise. Doing deep-breathing exercises. Doing yoga. Meditating. What benefits will I see if I quit smoking? Over time, you may have:  A better sense of smell and taste. Less coughing and sore throat. A slower heart rate. Lower blood pressure. Clearer skin. Better breathing. Fewer sick days. Summary Quitting smoking can be hard, but it is one of the best things that you can do for your health. Do not give up if you cannot quit the first time. Some people need to try many times to quit. When you decide to quit smoking, make a plan to help you succeed. Quit smoking right away, not slowly over a period of time. When you start quitting, get help and support to keep you smoke-free. This information is not intended to replace advice given to you by your health care provider. Make sure you discuss any questions you have with your health care provider. Document Revised: 02/21/2021 Document Reviewed: 02/21/2021 Elsevier Patient Education  2024 [employer].

## 2023-03-24 NOTE — Progress Notes (Signed)
 Because this visit was a virtual/telehealth visit,  certain criteria was not obtained, such a blood pressure, CBG if applicable, and timed get up and go. Any medications not marked as taking were not mentioned during the medication reconciliation part of the visit. Any vitals not documented were not able to be obtained due to this being a telehealth visit or patient was unable to self-report a recent blood pressure reading due to a lack of equipment at home via telehealth. Vitals that have been documented are verbally provided by the patient.   Interactive audio and video telecommunications were attempted between this provider and patient, however failed, due to patient having technical difficulties OR patient did not have access to video capability.  We continued and completed visit with audio only.  Subjective:   Warren Patrick is a 68 y.o. male who presents for Medicare Annual/Subsequent preventive examination.  Visit Complete: Virtual I connected with  Warren Patrick on 03/24/23 by a audio enabled telemedicine application and verified that I am speaking with the correct person using two identifiers.  Patient Location: Home  Provider Location: Office/Clinic  I discussed the limitations of evaluation and management by telemedicine. The patient expressed understanding and agreed to proceed.  Vital Signs: Because this visit was a virtual/telehealth visit, some criteria may be missing or patient reported. Any vitals not documented were not able to be obtained and vitals that have been documented are patient reported.  Patient Medicare AWV questionnaire was completed by the patient on na; I have confirmed that all information answered by patient is correct and no changes since this date.  Cardiac Risk Factors include: advanced age (>73men, >55 women);hypertension;smoking/ tobacco exposure     Objective:    Today's Vitals   03/24/23 1129 03/24/23 1130  Weight: 126 lb 8 oz (57.4 kg)    Height: 5' 9 (1.753 m)   PainSc:  8    Body mass index is 18.68 kg/m.     03/24/2023   11:34 AM 11/06/2022    9:24 PM 04/07/2022    3:44 PM 04/03/2021    4:33 PM 05/05/2018    6:27 PM 03/30/2017    4:05 AM 04/13/2016    3:50 PM  Advanced Directives  Does Patient Have a Medical Advance Directive? No No No No No No No  Would patient like information on creating a medical advance directive? No - Patient declined  Yes (ED - Information included in AVS) No - Patient declined No - Patient declined No - Patient declined No - Patient declined    Current Medications (verified) Outpatient Encounter Medications as of 03/24/2023  Medication Sig   alfuzosin  (UROXATRAL ) 10 MG 24 hr tablet Take 1 tablet (10 mg total) by mouth daily with breakfast.   amLODipine  (NORVASC ) 5 MG tablet Take 1 tablet (5 mg total) by mouth daily.   hydrOXYzine  (VISTARIL ) 25 MG capsule TAKE 1 CAPSULE (25 MG TOTAL) BY MOUTH AT BEDTIME AS NEEDED FOR ANXIETY (INSOMNIA).   levocetirizine (XYZAL ) 5 MG tablet TAKE 1 TABLET BY MOUTH EVERY DAY IN THE EVENING   omeprazole  (PRILOSEC) 20 MG capsule TAKE 1 CAPSULE BY MOUTH EVERY DAY   No facility-administered encounter medications on file as of 03/24/2023.    Allergies (verified) Bee venom   History: Past Medical History:  Diagnosis Date   Drug-seeking behavior    ETOH abuse    Hematuria, microscopic    HTN (hypertension)    PTSD (post-traumatic stress disorder)    Vitamin D  deficiency  Past Surgical History:  Procedure Laterality Date   APPENDECTOMY     ROTATOR CUFF REPAIR     right shoulder, on 10/2013   TONSILLECTOMY     Family History  Problem Relation Age of Onset   Cancer Mother        breast cancer   Cancer Father        lung cancer   Hypertension Father    Diabetes Sister    Cancer Brother        pancreatic cancer   Vision loss Daughter    Diabetes Daughter    Social History   Socioeconomic History   Marital status: Divorced    Spouse name: Not on  file   Number of children: 0   Years of education: 9   Highest education level: GED or equivalent  Occupational History   Occupation: retired  Tobacco Use   Smoking status: Every Day    Current packs/day: 0.50    Average packs/day: 0.5 packs/day for 45.0 years (22.5 ttl pk-yrs)    Types: Cigarettes   Smokeless tobacco: Never   Tobacco comments:    1/2PPD  Vaping Use   Vaping status: Never Used  Substance and Sexual Activity   Alcohol use: Yes    Alcohol/week: 28.0 standard drinks of alcohol    Types: 28 Standard drinks or equivalent per week    Comment: a pint of Bourbon a month   Drug use: No   Sexual activity: Yes    Partners: Female  Other Topics Concern   Not on file  Social History Narrative    Divorced. Retired from nurse, children's. Served in [employer]. 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 No children. Lives with current fiance, have been in a relationship for 7 years.   Social Drivers of Corporate Investment Banker Strain: Low Risk  (03/24/2023)   Overall Financial Resource Strain (CARDIA)    Difficulty of Paying Living Expenses: Not hard at all  Food Insecurity: No Food Insecurity (03/24/2023)   Hunger Vital Sign    Worried About Running Out of Food in the Last Year: Never true    Ran Out of Food in the Last Year: Never true  Transportation Needs: Unmet Transportation Needs (03/24/2023)   PRAPARE - Transportation    Lack of Transportation (Medical): Yes    Lack of Transportation (Non-Medical): Yes  Physical Activity: Sufficiently Active (03/24/2023)   Exercise Vital Sign    Days of Exercise per Week: 7 days    Minutes of Exercise per Session: 30 min  Stress: No Stress Concern Present (04/03/2021)   [employer] of Occupational Health - Occupational Stress Questionnaire    Feeling of Stress : Not at all  Social Connections: Moderately Isolated (03/24/2023)   Social Connection and Isolation Panel [NHANES]    Frequency of Communication with Friends and  Family: More than three times a week    Frequency of Social Gatherings with Friends and Family: Never    Attends Religious Services: Never    Database Administrator or Organizations: No    Attends Engineer, Structural: Never    Marital Status: Living with partner    Tobacco Counseling Ready to quit: Yes Counseling given: Yes Tobacco comments: 1/2PPD   Clinical Intake:  Pre-visit preparation completed: Yes  Pain : 0-10 Pain Score: 8  Pain Type: Chronic pain Pain Location: Knee Pain Orientation: Left Pain Descriptors / Indicators: Aching, Constant Pain Onset: More than a month ago  Pain Frequency: Constant     BMI - recorded: 18.68 Nutritional Status: BMI <19  Underweight Nutritional Risks: None Diabetes: No  How often do you need to have someone help you when you read instructions, pamphlets, or other written materials from your doctor or pharmacy?: 1 - Never  Interpreter Needed?: No  Information entered by :: Donnetta Gillin, CMA   Activities of Daily Living    03/24/2023   11:32 AM 04/07/2022    3:47 PM  In your present state of health, do you have any difficulty performing the following activities:  Hearing? 0 0  Vision? 0 0  Difficulty concentrating or making decisions? 0 0  Walking or climbing stairs? 1 0  Comment a little when going up stairs   Dressing or bathing? 0 0  Doing errands, shopping? 0 0  Preparing Food and eating ? N   Using the Toilet? N   In the past six months, have you accidently leaked urine? N   Do you have problems with loss of bowel control? N   Managing your Medications? N   Managing your Finances? N   Housekeeping or managing your Housekeeping? N     Patient Care Team: Tobie Suzzane POUR, MD as PCP - General (Internal Medicine) Seena Marsa NOVAK, MD (Internal Medicine)  Indicate any recent Medical Services you may have received from other than [facility] providers in the past year (date may be approximate).     Assessment:    This is a routine wellness examination for Warren Patrick.  Hearing/Vision screen Hearing Screening - Comments:: Patient denies any hearing difficulties.   Vision Screening - Comments:: Referral placed for eye doctor. Currently no transportation. Will place referral today.    Goals Addressed             This Visit's Progress    Patient Stated       Remain active and healthy       Depression Screen    03/24/2023   11:37 AM 02/18/2023    1:05 PM 04/07/2022    3:47 PM 10/07/2021    1:18 PM 04/03/2021    4:30 PM 02/12/2021    2:47 PM 04/13/2016    4:27 PM  PHQ 2/9 Scores  PHQ - 2 Score 0 0 0 0 0 0 5  PHQ- 9 Score       18    Fall Risk    03/24/2023   11:36 AM 02/18/2023    1:05 PM 04/07/2022    3:46 PM 10/07/2021    1:18 PM 04/03/2021    4:34 PM  Fall Risk   Falls in the past year? 0 0 1 1 1   Number falls in past yr: 0 0 0 1 0  Injury with Fall? 0 0 0 0 0  Risk for fall due to : No Fall Risks No Fall Risks  Impaired balance/gait   Follow up Falls prevention discussed Falls evaluation completed  Falls evaluation completed Falls evaluation completed    MEDICARE RISK AT HOME: Medicare Risk at Home Any stairs in or around the home?: No If so, are there any without handrails?: No Home free of loose throw rugs in walkways, pet beds, electrical cords, etc?: Yes Adequate lighting in your home to reduce risk of falls?: Yes Life alert?: No Use of a cane, walker or w/c?: No Grab bars in the bathroom?: No Shower chair or bench in shower?: No Elevated toilet seat or a handicapped toilet?: No  TIMED UP AND GO:  Was the test performed?  No    Cognitive Function:        03/24/2023   11:37 AM 04/07/2022    3:47 PM 04/03/2021    4:36 PM 04/03/2021    4:34 PM  6CIT Screen  What Year? 0 points 0 points  0 points  What month? 0 points 0 points  0 points  What time? 0 points 0 points 0 points   Count back from 20 0 points 0 points 0 points   Months in reverse 0 points 0 points 0 points    Repeat phrase 0 points 0 points 2 points   Total Score 0 points 0 points      Immunizations Immunization History  Administered Date(s) Administered   Fluad Trivalent(High Dose 65+) 02/18/2023   Influenza,inj,Quad PF,6+ Mos 04/13/2016, 01/26/2019, 12/28/2019   PNEUMOCOCCAL CONJUGATE-20 10/07/2021   Pneumococcal Polysaccharide-23 05/24/2018   Td 02/18/2011, 11/27/2015    TDAP status: Up to date  Flu Vaccine status: Up to date  Pneumococcal vaccine status: Up to date  Covid-19 vaccine status: Information provided on how to obtain vaccines.   Qualifies for Shingles Vaccine? Yes   Zostavax completed No   Shingrix Completed?: No.    Education has been provided regarding the importance of this vaccine. Patient has been advised to call insurance company to determine out of pocket expense if they have not yet received this vaccine. Advised may also receive vaccine at local pharmacy or Health Dept. Verbalized acceptance and understanding.  Screening Tests Health Maintenance  Topic Date Due   Fecal DNA (Cologuard)  Never done   Zoster Vaccines- Shingrix (1 of 2) Never done   COLON CANCER SCREENING ANNUAL FOBT  05/30/2016   COVID-19 Vaccine (1 - 2024-25 season) Never done   Medicare Annual Wellness (AWV)  04/08/2023   Lung Cancer Screening  11/07/2023   DTaP/Tdap/Td (3 - Tdap) 11/26/2025   Pneumonia Vaccine 59+ Years old  Completed   INFLUENZA VACCINE  Completed   Hepatitis C Screening  Completed   HPV VACCINES  Aged Out    Health Maintenance  Health Maintenance Due  Topic Date Due   Fecal DNA (Cologuard)  Never done   Zoster Vaccines- Shingrix (1 of 2) Never done   COLON CANCER SCREENING ANNUAL FOBT  05/30/2016   COVID-19 Vaccine (1 - 2024-25 season) Never done   Medicare Annual Wellness (AWV)  04/08/2023    Colorectal Cancer Screening: Patient has cologuard kit at home to complete and return for testing  Lung Cancer Screening: (Low Dose CT Chest recommended if Age 83-80  years, 20 pack-year currently smoking OR have quit w/in 15years.) does qualify.   Lung Cancer Screening Referral: lat lung cancer screening 11/07/2022  Additional Screening:  Hepatitis C Screening: does not qualify; Completed   Vision Screening: Recommended annual ophthalmology exams for early detection of glaucoma and other disorders of the eye. Is the patient up to date with their annual eye exam?  No  Who is the provider or what is the name of the office in which the patient attends annual eye exams? Patient requested referral today If pt is not established with a provider, would they like to be referred to a provider to establish care? Yes .   Dental Screening: Recommended annual dental exams for proper oral hygiene  Diabetic Foot Exam: na  Community Resource Referral / Chronic Care Management: CRR required this visit?  No   CCM required this visit?  No     Plan:  I have personally reviewed and noted the following in the patient's chart:   Medical and social history Use of alcohol, tobacco or illicit drugs  Current medications and supplements including opioid prescriptions. Patient is not currently taking opioid prescriptions. Functional ability and status Nutritional status Physical activity Advanced directives List of other physicians Hospitalizations, surgeries, and ER visits in previous 12 months Vitals Screenings to include cognitive, depression, and falls Referrals and appointments  In addition, I have reviewed and discussed with patient certain preventive protocols, quality metrics, and best practice recommendations. A written personalized care plan for preventive services as well as general preventive health recommendations were provided to patient.     [facility] LABOR Ricketta Colantonio, CMA   03/24/2023   After Visit Summary: (Mail) Due to this being a telephonic visit, the after visit summary with patients personalized plan was offered to patient via mail   Nurse Notes:  see routing comment

## 2023-04-21 ENCOUNTER — Ambulatory Visit: Payer: 59 | Admitting: Internal Medicine

## 2023-05-13 ENCOUNTER — Other Ambulatory Visit: Payer: Self-pay

## 2023-05-13 DIAGNOSIS — J3089 Other allergic rhinitis: Secondary | ICD-10-CM

## 2023-05-13 DIAGNOSIS — K219 Gastro-esophageal reflux disease without esophagitis: Secondary | ICD-10-CM

## 2023-05-13 DIAGNOSIS — N401 Enlarged prostate with lower urinary tract symptoms: Secondary | ICD-10-CM

## 2023-05-13 MED ORDER — LEVOCETIRIZINE DIHYDROCHLORIDE 5 MG PO TABS
5.0000 mg | ORAL_TABLET | Freq: Every evening | ORAL | 3 refills | Status: AC
Start: 2023-05-13 — End: ?

## 2023-05-13 MED ORDER — OMEPRAZOLE 20 MG PO CPDR: DELAYED_RELEASE_CAPSULE | ORAL | 3 refills | Status: DC

## 2023-05-13 MED ORDER — ALFUZOSIN HCL ER 10 MG PO TB24: 10.0000 mg | ORAL_TABLET | Freq: Every day | ORAL | 3 refills | Status: DC

## 2023-05-20 ENCOUNTER — Other Ambulatory Visit: Payer: Self-pay | Admitting: Internal Medicine

## 2023-05-20 DIAGNOSIS — N401 Enlarged prostate with lower urinary tract symptoms: Secondary | ICD-10-CM

## 2023-05-26 ENCOUNTER — Ambulatory Visit: Payer: 59 | Admitting: Internal Medicine

## 2023-06-01 ENCOUNTER — Other Ambulatory Visit: Payer: Self-pay

## 2023-06-01 DIAGNOSIS — N401 Enlarged prostate with lower urinary tract symptoms: Secondary | ICD-10-CM

## 2023-06-01 MED ORDER — AMLODIPINE BESYLATE 5 MG PO TABS: 5.0000 mg | ORAL_TABLET | Freq: Every day | ORAL | 1 refills | Status: DC

## 2023-07-21 ENCOUNTER — Other Ambulatory Visit: Payer: Self-pay | Admitting: Internal Medicine

## 2023-07-21 DIAGNOSIS — N401 Enlarged prostate with lower urinary tract symptoms: Secondary | ICD-10-CM

## 2023-09-06 ENCOUNTER — Ambulatory Visit (INDEPENDENT_AMBULATORY_CARE_PROVIDER_SITE_OTHER): Payer: Self-pay | Admitting: Internal Medicine

## 2023-09-06 ENCOUNTER — Encounter: Payer: Self-pay | Admitting: Internal Medicine

## 2023-09-06 VITALS — BP 121/61 | HR 70 | Ht 69.0 in | Wt 137.0 lb

## 2023-09-06 DIAGNOSIS — G629 Polyneuropathy, unspecified: Secondary | ICD-10-CM

## 2023-09-06 DIAGNOSIS — I10 Essential (primary) hypertension: Secondary | ICD-10-CM | POA: Diagnosis not present

## 2023-09-06 DIAGNOSIS — R350 Frequency of micturition: Secondary | ICD-10-CM

## 2023-09-06 DIAGNOSIS — R972 Elevated prostate specific antigen [PSA]: Secondary | ICD-10-CM | POA: Diagnosis not present

## 2023-09-06 DIAGNOSIS — F17210 Nicotine dependence, cigarettes, uncomplicated: Secondary | ICD-10-CM | POA: Diagnosis not present

## 2023-09-06 DIAGNOSIS — Z720 Tobacco use: Secondary | ICD-10-CM

## 2023-09-06 DIAGNOSIS — F1024 Alcohol dependence with alcohol-induced mood disorder: Secondary | ICD-10-CM

## 2023-09-06 DIAGNOSIS — N401 Enlarged prostate with lower urinary tract symptoms: Secondary | ICD-10-CM | POA: Diagnosis not present

## 2023-09-06 DIAGNOSIS — G4700 Insomnia, unspecified: Secondary | ICD-10-CM | POA: Diagnosis not present

## 2023-09-06 MED ORDER — GABAPENTIN 300 MG PO CAPS: 300.0000 mg | ORAL_CAPSULE | Freq: Every day | ORAL | 1 refills | Status: DC

## 2023-09-06 MED ORDER — AMLODIPINE BESYLATE 5 MG PO TABS
5.0000 mg | ORAL_TABLET | Freq: Every day | ORAL | 1 refills | Status: AC
Start: 2023-09-06 — End: ?

## 2023-09-06 NOTE — Assessment & Plan Note (Signed)
 Could be related to mood disorder and/or alcohol abuse Tried trazodone  and Vistaril  without much benefit He had better response to gabapentin, started gabapentin for neuropathy Avoid BZDs due to h/o alcohol abuse Sleep hygiene discussed

## 2023-09-06 NOTE — Assessment & Plan Note (Signed)
Smokes about 0.5 pack/day, used to smoke 1 pack/day, started at age 68  Asked about quitting: confirms that he currently smokes cigarettes Advise to quit smoking: Educated about QUITTING to reduce the risk of cancer, cardio and cerebrovascular disease. Assess willingness: Unwilling to quit at this time, but is working on cutting back. Assist with counseling and pharmacotherapy: Counseled for 5 minutes and literature provided. Arrange for follow up: follow up in 3 months and continue to offer help.

## 2023-09-06 NOTE — Progress Notes (Signed)
 Established Patient Office Visit  Subjective:  Patient ID: Warren Patrick, male    DOB: [birth year]  Age: 68 y.o. MRN: [id number]  CC:  Chief Complaint  Patient presents with   Hypertension    Follow up     HPI Warren Patrick is a 68 y.o. male with past medical history of HTN, GERD, insomnia, tobacco and alcohol abuse who presents for annual physical.  HTN: His blood pressure was WNL today.  He takes amlodipine  regularly now.  He denies any headache, dizziness, chest pain, dyspnea or palpitations.  BPH: He takes Uroxatral  for it. He had nocturia and overflow incontinence due to BPH, which has improved now. He had diarrhea with Flomax .  Denies any dysuria or hematuria.  Insomnia: She reports chronic insomnia, and did not like taking trazodone .  He takes at least 2 alcohol drinks to help with insomnia.  Neuropathy: He reports tingling and burning sensation of bilateral upper and lower extremities.  He has history of cervical vertebral fractures and chronic low back pain.  He has tried taking gabapentin from his family member with some relief.  Past Medical History:  Diagnosis Date   Drug-seeking behavior    ETOH abuse    Hematuria, microscopic    HTN (hypertension)    PTSD (post-traumatic stress disorder)    Vitamin D  deficiency     Past Surgical History:  Procedure Laterality Date   APPENDECTOMY     ROTATOR CUFF REPAIR     right shoulder, on 10/2013   TONSILLECTOMY      Family History  Problem Relation Age of Onset   Cancer Mother        breast cancer   Cancer Father        lung cancer   Hypertension Father    Diabetes Sister    Cancer Brother        pancreatic cancer   Vision loss Daughter    Diabetes Daughter     Social History   Socioeconomic History   Marital status: Divorced    Spouse name: Not on file   Number of children: 0   Years of education: 9   Highest education level: GED or equivalent  Occupational History   Occupation: retired  Tobacco  Use   Smoking status: Every Day    Current packs/day: 0.50    Average packs/day: 0.5 packs/day for 45.0 years (22.5 ttl pk-yrs)    Types: Cigarettes   Smokeless tobacco: Never   Tobacco comments:    1/2PPD  Vaping Use   Vaping status: Never Used  Substance and Sexual Activity   Alcohol use: Yes    Alcohol/week: 28.0 standard drinks of alcohol    Types: 28 Standard drinks or equivalent per week    Comment: a pint of Bourbon a month   Drug use: No   Sexual activity: Yes    Partners: Female  Other Topics Concern   Not on file  Social History Narrative    Divorced. 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 Lives with current fiance, have been in a relationship for 7 years.   Social Drivers of Corporate investment banker Strain: Low Risk  (03/24/2023)   Overall Financial Resource Strain (CARDIA)    Difficulty of Paying Living Expenses: Not hard at all  Food Insecurity: No Food Insecurity (03/24/2023)   Hunger Vital Sign    Worried About Programme researcher, broadcasting/film/video in  the Last Year: Never true    Ran Out of Food in the Last Year: Never true  Transportation Needs: Unmet Transportation Needs (03/24/2023)   PRAPARE - Transportation    Lack of Transportation (Medical): Yes    Lack of Transportation (Non-Medical): Yes  Physical Activity: Sufficiently Active (03/24/2023)   Exercise Vital Sign    Days of Exercise per Week: 7 days    Minutes of Exercise per Session: 30 min  Stress: No Stress Concern Present (04/03/2021)   [employer] of Occupational Health - Occupational Stress Questionnaire    Feeling of Stress : Not at all  Social Connections: Moderately Isolated (03/24/2023)   Social Connection and Isolation Panel    Frequency of Communication with Friends and Family: More than three times a week    Frequency of Social Gatherings with Friends and Family: Never    Attends Religious Services: Never    Database administrator  or Organizations: No    Attends Banker Meetings: Never    Marital Status: Living with partner  Intimate Partner Violence: Not At Risk (03/24/2023)   Humiliation, Afraid, Rape, and Kick questionnaire    Fear of Current or Ex-Partner: No    Emotionally Abused: No    Physically Abused: No    Sexually Abused: No    Outpatient Medications Prior to Visit  Medication Sig Dispense Refill   alfuzosin  (UROXATRAL ) 10 MG 24 hr tablet TAKE 1 TABLET BY MOUTH DAILY  WITH BREAKFAST 100 tablet 2   hydrOXYzine  (VISTARIL ) 25 MG capsule TAKE 1 CAPSULE (25 MG TOTAL) BY MOUTH AT BEDTIME AS NEEDED FOR ANXIETY (INSOMNIA). 90 capsule 1   levocetirizine (XYZAL ) 5 MG tablet Take 1 tablet (5 mg total) by mouth every evening. 90 tablet 3   omeprazole  (PRILOSEC) 20 MG capsule TAKE 1 CAPSULE BY MOUTH EVERY DAY 90 capsule 3   amLODipine  (NORVASC ) 5 MG tablet Take 1 tablet (5 mg total) by mouth daily. 90 tablet 1   No facility-administered medications prior to visit.    Allergies  Allergen Reactions   Bee Venom Swelling    ROS Review of Systems  Constitutional:  Negative for chills and fever.  HENT:  Negative for congestion and sore throat.   Eyes:  Negative for pain and discharge.  Respiratory:  Negative for cough and shortness of breath.   Cardiovascular:  Negative for chest pain and palpitations.  Gastrointestinal:  Negative for diarrhea, nausea and vomiting.  Endocrine: Negative for polydipsia and polyuria.  Genitourinary:  Positive for frequency. Negative for dysuria and hematuria.  Musculoskeletal:  Negative for neck pain and neck stiffness.  Skin:  Negative for rash.  Neurological:  Negative for dizziness and weakness.  Psychiatric/Behavioral:  Positive for sleep disturbance. Negative for agitation and behavioral problems.       Objective:    Physical Exam Vitals reviewed.  Constitutional:      General: He is not in acute distress.    Appearance: He is not diaphoretic.  HENT:      Head: Normocephalic and atraumatic.     Nose: Nose normal.     Mouth/Throat:     Mouth: Mucous membranes are moist.   Eyes:     General: No scleral icterus.    Extraocular Movements: Extraocular movements intact.    Cardiovascular:     Rate and Rhythm: Normal rate and regular rhythm.     Heart sounds: Normal heart sounds. No murmur heard. Pulmonary:     Breath sounds: Normal breath  sounds. No wheezing or rales.  Abdominal:     Palpations: Abdomen is soft.     Tenderness: There is no abdominal tenderness.   Musculoskeletal:     Cervical back: Neck supple. No tenderness.     Right lower leg: No edema.     Left lower leg: No edema.   Skin:    General: Skin is warm.     Findings: No rash.   Neurological:     General: No focal deficit present.     Mental Status: He is alert and oriented to person, place, and time.     Sensory: Sensory deficit (B/l LE) present.     Motor: No weakness.   Psychiatric:        Mood and Affect: Mood normal.        Behavior: Behavior normal.     BP 121/61   Pulse 70   Ht 5' 9 (1.753 m)   Wt 137 lb (62.1 kg)   SpO2 96%   BMI 20.23 kg/m  Wt Readings from Last 3 Encounters:  09/06/23 137 lb (62.1 kg)  03/24/23 126 lb 8 oz (57.4 kg)  02/18/23 126 lb 3.2 oz (57.2 kg)    Lab Results  Component Value Date   TSH 0.842 02/18/2023   Lab Results  Component Value Date   WBC 6.5 02/18/2023   HGB 16.1 02/18/2023   HCT 47.0 02/18/2023   MCV 95 02/18/2023   PLT 258 02/18/2023   Lab Results  Component Value Date   NA 138 02/18/2023   K 4.4 02/18/2023   CO2 25 02/18/2023   GLUCOSE 77 02/18/2023   BUN 10 02/18/2023   CREATININE 1.06 02/18/2023   BILITOT 0.9 02/18/2023   ALKPHOS 61 02/18/2023   AST 20 02/18/2023   ALT 13 02/18/2023   PROT 7.1 02/18/2023   ALBUMIN 4.5 02/18/2023   CALCIUM  9.5 02/18/2023   ANIONGAP 13 11/06/2022   EGFR 76 02/18/2023   Lab Results  Component Value Date   CHOL 161 02/18/2023   Lab Results   Component Value Date   HDL 61 02/18/2023   Lab Results  Component Value Date   LDLCALC 87 02/18/2023   Lab Results  Component Value Date   TRIG 64 02/18/2023   Lab Results  Component Value Date   CHOLHDL 2.6 02/18/2023   Lab Results  Component Value Date   HGBA1C 5.2 02/18/2023      Assessment & Plan:   Problem List Items Addressed This Visit       Cardiovascular and Mediastinum   Essential hypertension   BP Readings from Last 1 Encounters:  09/06/23 121/61   Well-controlled with Amlodipine  5 mg once daily Counseled to remain compliant to medications Advised DASH diet and moderate exercise/walking, at least 150 mins/week       Relevant Medications   amLODipine  (NORVASC ) 5 MG tablet   Other Relevant Orders   CMP14+EGFR     Nervous and Auditory   Neuropathy   Has tingling and burning sensation of bilateral UE and LE, could be related to nutritional deficiencies related to alcohol intake Advised to take B12, folic acid and thiamine  supplement Started gabapentin 300 mg nightly, may also help with insomnia       Relevant Medications   gabapentin (NEURONTIN) 300 MG capsule     Other   Tobacco abuse   Smokes about 0.5 pack/day, used to smoke 1 pack/day, started at age 35  Asked about quitting: confirms that he currently  smokes cigarettes Advise to quit smoking: Educated about QUITTING to reduce the risk of cancer, cardio and cerebrovascular disease. Assess willingness: Unwilling to quit at this time, but is working on cutting back. Assist with counseling and pharmacotherapy: Counseled for 5 minutes and literature provided. Arrange for follow up: follow up in 3 months and continue to offer help.       Insomnia   Could be related to mood disorder and/or alcohol abuse Tried trazodone  and Vistaril  without much benefit He had better response to gabapentin, started gabapentin for neuropathy Avoid BZDs due to h/o alcohol abuse Sleep hygiene discussed       Alcohol dependence (HCC)   He takes 1-2 alcoholic drinks maximum per day, and reports not drinking daily now He reports insomnia without alcohol, has tried providing medication for insomnia, but did not like them Advised to avoid more than 2 alcoholic drinks in a day Check CMP      Benign prostatic hyperplasia with urinary frequency - Primary   He had overflow incontinence due to BPH Had diarrhea with tamsulosin  Switched to Uroxatral  - now better If persistent, will refer to urology CT abdomen pelvis showed prostatomegaly during ER visit Check PSA      Relevant Medications   amLODipine  (NORVASC ) 5 MG tablet   Other Relevant Orders   PSA   Elevated PSA   PSA: 5.5 in 12/24 Had uncontrolled BPH symptoms at that time Now on Uroxatral , symptoms improved Recheck PSA      Relevant Orders   PSA     Meds ordered this encounter  Medications   gabapentin (NEURONTIN) 300 MG capsule    Sig: Take 1 capsule (300 mg total) by mouth at bedtime.    Dispense:  100 capsule    Refill:  1   amLODipine  (NORVASC ) 5 MG tablet    Sig: Take 1 tablet (5 mg total) by mouth daily.    Dispense:  100 tablet    Refill:  1    Follow-up: Return in about 6 months (around 03/07/2024) for HTN and neuropathy.    Suzzane MARLA Blanch, MD

## 2023-09-06 NOTE — Assessment & Plan Note (Signed)
 Has tingling and burning sensation of bilateral UE and LE, could be related to nutritional deficiencies related to alcohol intake Advised to take B12, folic acid and thiamine  supplement Started gabapentin 300 mg nightly, may also help with insomnia

## 2023-09-06 NOTE — Assessment & Plan Note (Signed)
 PSA: 5.5 in 12/24 Had uncontrolled BPH symptoms at that time Now on Uroxatral , symptoms improved Recheck PSA

## 2023-09-06 NOTE — Assessment & Plan Note (Signed)
 He had overflow incontinence due to BPH Had diarrhea with tamsulosin  Switched to Uroxatral  - now better If persistent, will refer to urology CT abdomen pelvis showed prostatomegaly during ER visit Check PSA

## 2023-09-06 NOTE — Patient Instructions (Signed)
 Please start taking Gabapentin as prescribed for neuropathy.  Please continue to take medications as prescribed.  Please continue to follow low salt diet and perform moderate exercise/walking as tolerated.

## 2023-09-06 NOTE — Assessment & Plan Note (Signed)
 He takes 1-2 alcoholic drinks maximum per day, and reports not drinking daily now He reports insomnia without alcohol, has tried providing medication for insomnia, but did not like them Advised to avoid more than 2 alcoholic drinks in a day Check CMP

## 2023-09-06 NOTE — Assessment & Plan Note (Addendum)
 BP Readings from Last 1 Encounters:  09/06/23 121/61   Well-controlled with Amlodipine  5 mg once daily Counseled to remain compliant to medications Advised DASH diet and moderate exercise/walking, at least 150 mins/week

## 2023-09-07 ENCOUNTER — Ambulatory Visit: Payer: Self-pay | Admitting: Internal Medicine

## 2023-09-07 LAB — CMP14+EGFR
ALT: 14 IU/L (ref 0–44)
AST: 25 IU/L (ref 0–40)
Albumin: 4.3 g/dL (ref 3.9–4.9)
Alkaline Phosphatase: 57 IU/L (ref 44–121)
BUN/Creatinine Ratio: 14 (ref 10–24)
BUN: 15 mg/dL (ref 8–27)
Bilirubin Total: 0.4 mg/dL (ref 0.0–1.2)
CO2: 23 mmol/L (ref 20–29)
Calcium: 9.4 mg/dL (ref 8.6–10.2)
Chloride: 101 mmol/L (ref 96–106)
Creatinine, Ser: 1.11 mg/dL (ref 0.76–1.27)
Globulin, Total: 2.4 g/dL (ref 1.5–4.5)
Glucose: 95 mg/dL (ref 70–99)
Potassium: 4.1 mmol/L (ref 3.5–5.2)
Sodium: 138 mmol/L (ref 134–144)
Total Protein: 6.7 g/dL (ref 6.0–8.5)
eGFR: 72 mL/min/{1.73_m2} (ref >59)

## 2023-09-07 LAB — PSA: Prostate Specific Ag, Serum: 4.5 ng/mL — ABNORMAL HIGH (ref 0.0–4.0)

## 2023-10-08 ENCOUNTER — Telehealth: Payer: Self-pay | Admitting: Internal Medicine

## 2023-10-08 NOTE — Telephone Encounter (Signed)
 Copied from CRM [id number]. Topic: Clinical - Medication Question >> Oct 08, 2023  3:17 PM Everette C wrote: Reason for CRM: The patient would like to speak with a member of clinical staff about a potential refill of their previously discontinued EPIPEN 2-PAK 0.3 MG/0.3ML SOAJ injection [[id number]]  Please contact the patient when possible

## 2023-10-11 ENCOUNTER — Ambulatory Visit: Payer: Self-pay

## 2023-10-11 NOTE — Telephone Encounter (Signed)
 Duplicate message

## 2023-10-11 NOTE — Telephone Encounter (Signed)
 FYI Only or Action Required?: Action required by provider: medication refill request.  Patient was last seen in primary care on 09/06/2023 by Tobie Suzzane POUR, MD.  Called Nurse Triage reporting Medication Refill.  Symptoms began several days ago.  Interventions attempted: Nothing.  Symptoms are: stable. Requesting refill on Epi pen, old prescription is not on medication list. Pt. Has bee allergy. Please call [phone] when refill is done. Using [facility].  Triage Disposition: Call PCP Now  Patient/caregiver understands and will follow disposition?: Yes    Copied from CRM [id number]. Topic: Clinical - Medication Question >> Oct 08, 2023  3:17 PM Warren Patrick wrote: Reason for CRM: The patient would like to speak with a member of clinical staff about a potential refill of their previously discontinued EPIPEN 2-PAK 0.3 MG/0.3ML SOAJ injection [[id number]]  Please contact the patient when possible >> Oct 11, 2023  1:09 PM Warren Patrick wrote: Patient calling to check the status of Epi Pen. Transferred to NT.  Reason for Disposition  [1] Prescription refill request for ESSENTIAL medicine (i.e., likelihood of harm to patient if not taken) AND [2] triager unable to refill per department policy  Answer Assessment - Initial Assessment Questions 1. DRUG NAME: What medicine do you need to have refilled?     Epi pen 2. REFILLS REMAINING: How many refills are remaining? Notes: The label on the medicine or pill bottle will show how many refills are remaining. If there are no refills remaining, then a renewal may be needed.     0 3. EXPIRATION DATE: What is the expiration date? Note: The label states when the prescription will expire, and thus can no longer be refilled.)     expired 4. PRESCRIBER: Who prescribed it? Note: The prescribing doctor or group is responsible for refill approvals.SABRA     PCP 5. PHARMACY: Have you contacted your pharmacy (drugstore)? Note: Some pharmacies  will contact the doctor (or NP/PA).      YES 6. SYMPTOMS: Do you have any symptoms?     N/A 7. PREGNANCY: Is there any chance that you are pregnant? When was your last menstrual period?     N/A  Protocols used: Medication Refill and Renewal Call-A-AH

## 2023-10-17 ENCOUNTER — Other Ambulatory Visit: Payer: Self-pay | Admitting: Internal Medicine

## 2023-10-17 DIAGNOSIS — Z87892 Personal history of anaphylaxis: Secondary | ICD-10-CM

## 2023-10-17 MED ORDER — EPINEPHRINE 0.3 MG/0.3ML IJ SOAJ: 0.3000 mg | INTRAMUSCULAR | 1 refills | Status: AC | PRN

## 2023-10-18 NOTE — Telephone Encounter (Signed)
 Left voicemail to call back  Reason for call documented in chart

## 2023-10-18 NOTE — Telephone Encounter (Signed)
 Called pt to inform him that his Epipen  RX has been sent to [facility]

## 2023-12-17 DIAGNOSIS — K219 Gastro-esophageal reflux disease without esophagitis: Secondary | ICD-10-CM | POA: Diagnosis not present

## 2023-12-17 DIAGNOSIS — Z23 Encounter for immunization: Secondary | ICD-10-CM | POA: Diagnosis not present

## 2023-12-17 DIAGNOSIS — Z7689 Persons encountering health services in other specified circumstances: Secondary | ICD-10-CM | POA: Diagnosis not present

## 2023-12-17 DIAGNOSIS — G479 Sleep disorder, unspecified: Secondary | ICD-10-CM | POA: Diagnosis not present

## 2023-12-17 DIAGNOSIS — Z122 Encounter for screening for malignant neoplasm of respiratory organs: Secondary | ICD-10-CM | POA: Diagnosis not present

## 2023-12-17 DIAGNOSIS — Z1211 Encounter for screening for malignant neoplasm of colon: Secondary | ICD-10-CM | POA: Diagnosis not present

## 2023-12-17 DIAGNOSIS — Z87891 Personal history of nicotine dependence: Secondary | ICD-10-CM | POA: Diagnosis not present

## 2023-12-17 DIAGNOSIS — T148XXA Other injury of unspecified body region, initial encounter: Secondary | ICD-10-CM | POA: Diagnosis not present

## 2023-12-17 DIAGNOSIS — J301 Allergic rhinitis due to pollen: Secondary | ICD-10-CM | POA: Diagnosis not present

## 2023-12-17 DIAGNOSIS — R3914 Feeling of incomplete bladder emptying: Secondary | ICD-10-CM | POA: Diagnosis not present

## 2023-12-17 DIAGNOSIS — I10 Essential (primary) hypertension: Secondary | ICD-10-CM | POA: Diagnosis not present

## 2024-01-04 DIAGNOSIS — F17210 Nicotine dependence, cigarettes, uncomplicated: Secondary | ICD-10-CM | POA: Diagnosis not present

## 2024-01-04 DIAGNOSIS — Z87891 Personal history of nicotine dependence: Secondary | ICD-10-CM | POA: Diagnosis not present

## 2024-01-04 DIAGNOSIS — Z122 Encounter for screening for malignant neoplasm of respiratory organs: Secondary | ICD-10-CM | POA: Diagnosis not present

## 2024-01-19 ENCOUNTER — Other Ambulatory Visit: Payer: Self-pay | Admitting: Internal Medicine

## 2024-01-19 DIAGNOSIS — G629 Polyneuropathy, unspecified: Secondary | ICD-10-CM

## 2024-02-23 ENCOUNTER — Other Ambulatory Visit: Payer: Self-pay | Admitting: Internal Medicine

## 2024-02-23 DIAGNOSIS — N401 Enlarged prostate with lower urinary tract symptoms: Secondary | ICD-10-CM

## 2024-03-07 ENCOUNTER — Ambulatory Visit: Admitting: Internal Medicine

## 2024-03-11 ENCOUNTER — Other Ambulatory Visit: Payer: Self-pay | Admitting: Internal Medicine

## 2024-03-11 DIAGNOSIS — N401 Enlarged prostate with lower urinary tract symptoms: Secondary | ICD-10-CM

## 2024-03-11 DIAGNOSIS — K219 Gastro-esophageal reflux disease without esophagitis: Secondary | ICD-10-CM

## 2024-03-27 ENCOUNTER — Ambulatory Visit: Payer: 59
